# Patient Record
Sex: Female | Born: 1945
Health system: Southern US, Community
[De-identification: ages and names within clinical notes are randomized; demographics above are authoritative.]

## PROBLEM LIST (undated history)

## (undated) DIAGNOSIS — Z9289 Personal history of other medical treatment: Secondary | ICD-10-CM

## (undated) DIAGNOSIS — R51 Headache: Secondary | ICD-10-CM

## (undated) DIAGNOSIS — I471 Supraventricular tachycardia, unspecified: Secondary | ICD-10-CM

## (undated) DIAGNOSIS — K219 Gastro-esophageal reflux disease without esophagitis: Secondary | ICD-10-CM

## (undated) DIAGNOSIS — T8859XA Other complications of anesthesia, initial encounter: Secondary | ICD-10-CM

## (undated) DIAGNOSIS — I472 Ventricular tachycardia: Secondary | ICD-10-CM

## (undated) DIAGNOSIS — I4510 Unspecified right bundle-branch block: Secondary | ICD-10-CM

## (undated) DIAGNOSIS — R519 Headache, unspecified: Secondary | ICD-10-CM

## (undated) DIAGNOSIS — Z950 Presence of cardiac pacemaker: Secondary | ICD-10-CM

## (undated) DIAGNOSIS — Z8679 Personal history of other diseases of the circulatory system: Secondary | ICD-10-CM

## (undated) DIAGNOSIS — T4145XA Adverse effect of unspecified anesthetic, initial encounter: Secondary | ICD-10-CM

## (undated) DIAGNOSIS — Z87898 Personal history of other specified conditions: Secondary | ICD-10-CM

## (undated) DIAGNOSIS — I10 Essential (primary) hypertension: Secondary | ICD-10-CM

## (undated) DIAGNOSIS — I4729 Other ventricular tachycardia: Secondary | ICD-10-CM

## (undated) DIAGNOSIS — S0990XA Unspecified injury of head, initial encounter: Secondary | ICD-10-CM

## (undated) DIAGNOSIS — I455 Other specified heart block: Secondary | ICD-10-CM

## (undated) HISTORY — DX: Other specified heart block: I45.5

## (undated) HISTORY — DX: Personal history of other diseases of the circulatory system: Z86.79

## (undated) HISTORY — DX: Essential (primary) hypertension: I10

## (undated) HISTORY — DX: Gastro-esophageal reflux disease without esophagitis: K21.9

## (undated) HISTORY — DX: Presence of cardiac pacemaker: Z95.0

## (undated) HISTORY — DX: Other ventricular tachycardia: I47.29

## (undated) HISTORY — DX: Personal history of other medical treatment: Z92.89

## (undated) HISTORY — DX: Personal history of other specified conditions: Z87.898

## (undated) HISTORY — DX: Supraventricular tachycardia, unspecified: I47.10

## (undated) HISTORY — DX: Ventricular tachycardia: I47.2

## (undated) HISTORY — PX: OTHER SURGICAL HISTORY: SHX169

## (undated) HISTORY — PX: CHOLECYSTECTOMY: SHX55

## (undated) HISTORY — DX: Supraventricular tachycardia: I47.1

## (undated) HISTORY — DX: Unspecified right bundle-branch block: I45.10

---

## 1998-10-10 ENCOUNTER — Other Ambulatory Visit: Admission: RE | Admit: 1998-10-10 | Discharge: 1998-10-10 | Payer: Self-pay | Admitting: *Deleted

## 1999-10-31 ENCOUNTER — Other Ambulatory Visit: Admission: RE | Admit: 1999-10-31 | Discharge: 1999-10-31 | Payer: Self-pay | Admitting: *Deleted

## 2000-01-10 ENCOUNTER — Encounter (INDEPENDENT_AMBULATORY_CARE_PROVIDER_SITE_OTHER): Payer: Self-pay | Admitting: Specialist

## 2000-01-10 ENCOUNTER — Ambulatory Visit (HOSPITAL_COMMUNITY): Admission: RE | Admit: 2000-01-10 | Discharge: 2000-01-10 | Payer: Self-pay | Admitting: Gastroenterology

## 2000-01-16 ENCOUNTER — Ambulatory Visit (HOSPITAL_COMMUNITY): Admission: RE | Admit: 2000-01-16 | Discharge: 2000-01-16 | Payer: Self-pay | Admitting: Gastroenterology

## 2001-12-09 ENCOUNTER — Encounter: Payer: Self-pay | Admitting: Gastroenterology

## 2001-12-09 ENCOUNTER — Ambulatory Visit (HOSPITAL_COMMUNITY): Admission: RE | Admit: 2001-12-09 | Discharge: 2001-12-09 | Payer: Self-pay | Admitting: Gastroenterology

## 2002-08-11 ENCOUNTER — Ambulatory Visit (HOSPITAL_COMMUNITY): Admission: RE | Admit: 2002-08-11 | Discharge: 2002-08-11 | Payer: Self-pay

## 2002-08-11 ENCOUNTER — Encounter: Payer: Self-pay | Admitting: Obstetrics and Gynecology

## 2002-08-13 ENCOUNTER — Other Ambulatory Visit: Admission: RE | Admit: 2002-08-13 | Discharge: 2002-08-13 | Payer: Self-pay | Admitting: Obstetrics and Gynecology

## 2002-08-18 ENCOUNTER — Encounter: Payer: Self-pay | Admitting: Endocrinology

## 2002-08-18 ENCOUNTER — Encounter: Admission: RE | Admit: 2002-08-18 | Discharge: 2002-08-18 | Payer: Self-pay | Admitting: Endocrinology

## 2003-08-12 ENCOUNTER — Ambulatory Visit (HOSPITAL_COMMUNITY): Admission: RE | Admit: 2003-08-12 | Discharge: 2003-08-12 | Payer: Self-pay | Admitting: Internal Medicine

## 2004-03-14 ENCOUNTER — Ambulatory Visit: Payer: Self-pay | Admitting: Internal Medicine

## 2004-03-21 ENCOUNTER — Ambulatory Visit: Payer: Self-pay | Admitting: Internal Medicine

## 2005-02-23 ENCOUNTER — Ambulatory Visit (HOSPITAL_COMMUNITY): Admission: RE | Admit: 2005-02-23 | Discharge: 2005-02-23 | Payer: Self-pay | Admitting: Internal Medicine

## 2006-08-03 ENCOUNTER — Emergency Department (HOSPITAL_COMMUNITY): Admission: EM | Admit: 2006-08-03 | Discharge: 2006-08-03 | Payer: Self-pay | Admitting: Emergency Medicine

## 2006-10-02 ENCOUNTER — Encounter: Admission: RE | Admit: 2006-10-02 | Discharge: 2006-10-02 | Payer: Self-pay | Admitting: Obstetrics & Gynecology

## 2007-04-17 DIAGNOSIS — I455 Other specified heart block: Secondary | ICD-10-CM

## 2007-04-17 DIAGNOSIS — S0990XA Unspecified injury of head, initial encounter: Secondary | ICD-10-CM

## 2007-04-17 HISTORY — DX: Other specified heart block: I45.5

## 2007-04-17 HISTORY — DX: Unspecified injury of head, initial encounter: S09.90XA

## 2008-01-14 ENCOUNTER — Ambulatory Visit (HOSPITAL_COMMUNITY): Admission: RE | Admit: 2008-01-14 | Discharge: 2008-01-15 | Payer: Self-pay | Admitting: Cardiology

## 2008-01-14 DIAGNOSIS — Z950 Presence of cardiac pacemaker: Secondary | ICD-10-CM | POA: Insufficient documentation

## 2008-01-14 HISTORY — DX: Presence of cardiac pacemaker: Z95.0

## 2008-01-14 HISTORY — PX: PACEMAKER INSERTION: SHX728

## 2009-07-15 ENCOUNTER — Encounter: Admission: RE | Admit: 2009-07-15 | Discharge: 2009-07-15 | Payer: Self-pay | Admitting: Obstetrics & Gynecology

## 2010-01-16 ENCOUNTER — Encounter: Admission: RE | Admit: 2010-01-16 | Discharge: 2010-01-16 | Payer: Self-pay | Admitting: Obstetrics & Gynecology

## 2010-04-16 HISTORY — PX: COLONOSCOPY W/ POLYPECTOMY: SHX1380

## 2010-05-16 ENCOUNTER — Other Ambulatory Visit: Payer: Self-pay | Admitting: Dermatology

## 2010-05-31 ENCOUNTER — Other Ambulatory Visit: Payer: Self-pay | Admitting: Obstetrics & Gynecology

## 2010-05-31 DIAGNOSIS — Z1231 Encounter for screening mammogram for malignant neoplasm of breast: Secondary | ICD-10-CM

## 2010-07-18 ENCOUNTER — Ambulatory Visit
Admission: RE | Admit: 2010-07-18 | Discharge: 2010-07-18 | Disposition: A | Discharge status: Home / Self Care | Payer: No Typology Code available for payment source | Source: Ambulatory Visit | Attending: Obstetrics & Gynecology | Admitting: Obstetrics & Gynecology

## 2010-07-18 DIAGNOSIS — Z1231 Encounter for screening mammogram for malignant neoplasm of breast: Secondary | ICD-10-CM

## 2010-09-01 NOTE — Procedures (Signed)
[facility]  Patient:    Tiffany Baird, Tiffany Baird                   MRN: [id number] Proc. Date: 01/16/00 Adm. Date:  [id number] Attending:  Deneen Harts CC:         Kerry Kass, M.D. [facility]   Procedure Report  PROCEDURE:  Panendoscopy with biopsy, esophageal dilation.  INDICATIONS:  A 65 year old white female with symptoms of pyrosis, regurgitation, and occasional solid food dysphagia.  Undergoing endoscopy to further evaluate the etiology of symptoms and for a possible therapeutic intervention.  DESCRIPTION OF PROCEDURE:  After reviewing the nature of the procedure with the patient including potential risks of hemorrhage and perforation, informed consent was signed.  The patient was premedicated with topical anesthetic followed by IV sedation, totalling Versed 7.5 mg, fentanyl 75 mcg administered IV in divided doses prior to the onset of the procedure.  The patient also received topical oropharyngeal anesthetic.  Using the Olympus video endoscope, proximal esophagus was intubated under direct vision.  The oropharynx was normal.  No lesion of the epiglottis, vocal cords, or piriform sinus.  The proximal and mid esophagus were normal.  The distal esophagus was normal to the mucosal Z-line.  A Schatzkis ring was present at 37 cm.  No evidence of reflux-induced inflammation.  The Schatzkis ring was endoscopically evident but not obstructing.  Moderate hiatal hernia extending to 40 cm, noninflamed.  Gastric fundus and body were normal.  Antrum remarkable for erosive gastritis.  No ulceration.  Mucosa characterized by edema, erythema, and superficial mucosal erosions.  Pylorus symmetric. DuodenaL bulb with similar erosive inflammation.  Second portion of the duodenum was normal.  Retroflexed view of the angularis, lesser curve, gastric cardia, and fundus were normal.  Biopsies were obtained from the angularis and prepyloric antrum for Helicobacter.   Stomach was then decompressed and scope withdrawn.  Immediately following endoscopy, a Maloney 50 [place] dilator was passed to 45 cm.  There was minimal distal esophageal resistance.  No additional dilators were passed.  The patient tolerated this without chest pain, without coughing, without blood staining of the dilator.  The patient remained stable throughout the procedures being maintained on low-flow oxygen and Datascope monitor throughout.  Returned to recovery in stable condition.  ASSESSMENT: 1. Schatzkis ring - probable source of solid food dysphagia. 2. Moderate hiatal hernia, noninflamed. 3. [name] 50 [place] abrupt dilation. 4. Erosive antritis. 5. Erosive duodenitis.  RECOMMENDATION: 1. Prevacid 30 mg p.o. q.a.m. 2. Treat if Helicobacter positive on CLO biopsy. 3. Avoid gastric irritants. DD:  01/16/00 TD:  01/16/00 Job: [id number]

## 2010-09-01 NOTE — Procedures (Signed)
[facility]  Patient:    Tiffany Baird, Tiffany Baird                   MRN: [id number] Proc. Date: 01/10/00 Adm. Date:  [id number] Attending:  Deneen Harts CC:         Kerry Kass, M.D. [facility]   Procedure Report  PROCEDURE:  Colonoscopic polypectomy.  INDICATION FOR PROCEDURE:  A 65 year old white female undergoing colonoscopy for neoplasia surveillance. Symptoms of intermittent hematochezia. Recent history of infectious diarrhea following a trip to [place] one month ago. The symptoms resolved with Cipro for 1 week.  DESCRIPTION OF PROCEDURE:  After reviewing the nature of the procedure with the patient including potential risks and complications, and after discussing alternative methods of diagnosis and treatment, informed consent was signed.  The patient was premedicated receiving IV sedation totaling Versed 9 mg, fentanyl 87.5 mcg IV.  Using the Olympus pediatric PCF-140L pediatric video colonoscope, the rectum was intubated after a normal digital examination which revealed no evidence of perianal or intrarectal pathology. The scope was advanced with some difficulty through a tortuous colon. The cecum was identified by crows feet, ileocecal valve, and transillumination of the right lower quadrant. Preparation was good throughout.  The scope was slowly withdrawn with careful inspection of the entire colon in a retrograde manner with excellent visualization throughout. In the mid descending colon at approximately 65 cm, a sessile polyp, 6 mm in diameter, was resected with hot biopsy forceps. This appeared to be an adenoma. It is submitted to pathology. No additional neoplasia identified. In the rectum, on retroflexed view, internal hemorrhoids are noted. This is a probable explanation for the patients hematochezia. The colon was then decompressed, scope withdrawn. The mucosa was intact throughout without vascular abnormality or other  lesion.  The patient tolerated the procedure without difficulty being maintained on DataScope monitor and low flow oxygen throughout. Returned to recovery in stable condition.  Time 3, technical 2, preparation 2, total score equal 7.  ASSESSMENT: 1. Descending colon polyp--resected, pathology pending. 2. Internal hemorrhoids--probable source of hematochezia.  RECOMMENDATIONS: 1. Post polypectomy instructions reviewed. 2. Follow-up pathology. 3. Repeat colonoscopy in 3 years if adenoma, 10 years if hyperplastic. 4. Annual Hemoccults--per Dr. Stevphen Rochester. DD:  01/10/00 TD:  01/11/00 Job: [id number]

## 2011-01-15 LAB — BASIC METABOLIC PANEL
BUN: 20
CO2: 24
Calcium: 9.5
Chloride: 111
Creatinine, Ser: 0.78
GFR calc Af Amer: >60
GFR calc non Af Amer: >60
Glucose, Bld: 96
Potassium: 3.7
Sodium: 142

## 2011-01-15 LAB — PROTIME-INR
INR: 1
Prothrombin Time: 13.5

## 2011-01-15 LAB — CBC
HCT: 39.2
Hemoglobin: 13.3
MCHC: 33.9
MCV: 84.9
Platelets: 277
RBC: 4.62
RDW: 13.5
WBC: 5.5

## 2011-01-15 LAB — APTT: aPTT: 28

## 2011-03-27 DIAGNOSIS — Z9289 Personal history of other medical treatment: Secondary | ICD-10-CM

## 2011-03-27 HISTORY — DX: Personal history of other medical treatment: Z92.89

## 2011-03-27 HISTORY — PX: US ECHOCARDIOGRAPHY: HXRAD669

## 2011-03-27 HISTORY — PX: NM MYOCAR PERF WALL MOTION: HXRAD629

## 2012-09-29 ENCOUNTER — Other Ambulatory Visit: Payer: Self-pay | Admitting: Cardiovascular Disease

## 2012-09-29 DIAGNOSIS — I495 Sick sinus syndrome: Secondary | ICD-10-CM

## 2012-09-29 DIAGNOSIS — I4891 Unspecified atrial fibrillation: Secondary | ICD-10-CM

## 2012-09-29 LAB — PACEMAKER DEVICE OBSERVATION

## 2012-09-30 ENCOUNTER — Encounter: Payer: Self-pay | Admitting: *Deleted

## 2012-09-30 LAB — REMOTE PACEMAKER DEVICE
AL AMPLITUDE: 1.4 mv
AL THRESHOLD: 0.75 V
BAMS-0001: 175 {beats}/min
BATTERY VOLTAGE: 2.8 V
RV LEAD AMPLITUDE: 5.6 mv
RV LEAD IMPEDENCE PM: 531 Ohm
VENTRICULAR PACING PM: 1.3

## 2012-10-08 ENCOUNTER — Other Ambulatory Visit: Payer: Self-pay | Admitting: Obstetrics & Gynecology

## 2012-10-08 DIAGNOSIS — E2839 Other primary ovarian failure: Secondary | ICD-10-CM

## 2012-10-08 DIAGNOSIS — Z780 Asymptomatic menopausal state: Secondary | ICD-10-CM

## 2012-10-14 ENCOUNTER — Encounter: Payer: Self-pay | Admitting: Cardiovascular Disease

## 2012-12-31 ENCOUNTER — Other Ambulatory Visit: Payer: Self-pay | Admitting: Cardiovascular Disease

## 2012-12-31 LAB — PACEMAKER DEVICE OBSERVATION

## 2013-01-23 ENCOUNTER — Encounter: Payer: Self-pay | Admitting: *Deleted

## 2013-01-23 LAB — REMOTE PACEMAKER DEVICE
AL AMPLITUDE: 1.4 mv
AL THRESHOLD: 0.875 V
ATRIAL PACING PM: 13.8
BAMS-0001: 175 {beats}/min
BATTERY VOLTAGE: 2.8 V
RV LEAD AMPLITUDE: 8 mv
RV LEAD IMPEDENCE PM: 543 Ohm

## 2013-02-19 ENCOUNTER — Ambulatory Visit (INDEPENDENT_AMBULATORY_CARE_PROVIDER_SITE_OTHER): Payer: Medicare Other | Admitting: Cardiovascular Disease

## 2013-02-19 ENCOUNTER — Ambulatory Visit (INDEPENDENT_AMBULATORY_CARE_PROVIDER_SITE_OTHER): Payer: Medicare Other | Admitting: *Deleted

## 2013-02-19 ENCOUNTER — Encounter: Payer: Self-pay | Admitting: Cardiology

## 2013-02-19 VITALS — BP 120/80 | HR 65 | Resp 16 | Ht 64.0 in | Wt 188.0 lb

## 2013-02-19 DIAGNOSIS — I4729 Other ventricular tachycardia: Secondary | ICD-10-CM

## 2013-02-19 DIAGNOSIS — I472 Ventricular tachycardia: Secondary | ICD-10-CM

## 2013-02-19 DIAGNOSIS — Z950 Presence of cardiac pacemaker: Secondary | ICD-10-CM

## 2013-02-19 DIAGNOSIS — I471 Supraventricular tachycardia, unspecified: Secondary | ICD-10-CM

## 2013-02-19 DIAGNOSIS — I10 Essential (primary) hypertension: Secondary | ICD-10-CM

## 2013-02-19 DIAGNOSIS — R55 Syncope and collapse: Secondary | ICD-10-CM

## 2013-02-19 DIAGNOSIS — R002 Palpitations: Secondary | ICD-10-CM

## 2013-02-19 DIAGNOSIS — I495 Sick sinus syndrome: Secondary | ICD-10-CM

## 2013-02-19 DIAGNOSIS — Z8679 Personal history of other diseases of the circulatory system: Secondary | ICD-10-CM

## 2013-02-19 LAB — BASIC METABOLIC PANEL WITH GFR
BUN: 19 mg/dL (ref 6–23)
CO2: 29 mEq/L (ref 19–32)
Calcium: 9.8 mg/dL (ref 8.4–10.5)
Chloride: 104 mEq/L (ref 96–112)
Creat: 0.88 mg/dL (ref 0.50–1.10)
GFR, Est African American: 79 mL/min

## 2013-02-19 LAB — PACEMAKER DEVICE OBSERVATION

## 2013-02-19 MED ORDER — METOPROLOL TARTRATE 50 MG PO TABS: 25.0000 mg | ORAL_TABLET | Freq: Every day | ORAL | Status: DC

## 2013-02-19 NOTE — Assessment & Plan Note (Addendum)
Last p.m. attending a concert developed severe abdominal pain and hot sweaty felt faint she said she has had the symptoms several times in the past though extremely rare since her pacemaker insertion. She leaned against her husband and they exited the theater but she became more more lightheaded, very lightheaded and hot- then she had syncope EMS was called. She was awake on their arrival-no loss of bowel or bladder. EMS called- EKG with right bundle branch block. She refused to go to the hospital.  Better today though somewhat foggy.  She was reassured but instructed that if she felt lightheaded dizzy or similar symptoms that she should immediately lie down and drink lots of water. Last night she did have a glass of red wine prior to concert. Additionally pacemaker was adjusted to begin pacing at a earlier time.  She'll followup in 4 weeks for pacer check.

## 2013-02-19 NOTE — Assessment & Plan Note (Signed)
Patient had close to 7 second pulse on holter in 2009 and with her history of syncope it was felt pacemaker was treatment option of choice. Since the pacemaker has been in place she may feel lightheaded but has not actually had a syncopal spell until last night.  She was also concerned that she had a right bundle branch block she was reassured she had had that since 2009.  Pacemaker interrogated she had no arrhythmias in the last 24 hours. She had had as noted in the past some P. atrial tach and some short nonsustained ventricular tach.

## 2013-02-19 NOTE — Assessment & Plan Note (Signed)
She is on Lopressor 50 mg once a day I have asked her to take half a tablet twice a day as it is a shorter acting medication she is agreeable.

## 2013-02-19 NOTE — Assessment & Plan Note (Signed)
Stable

## 2013-02-19 NOTE — Patient Instructions (Addendum)
We adjusted your pacemaker, if your heart rate slows again, it will begin pacing sooner.  But your BP will also be lower so lie flat, and drink lots of water.  Being dehydrated may increase the episodes.  Follow up with Dr. Royann Shivers as instructed.  Your physician recommends that you schedule a follow-up appointment in: 4-6 weeks
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
## 2013-02-19 NOTE — Progress Notes (Signed)
In office pacemaker interrogation. Normal device function. Changes were made this session.

## 2013-02-23 NOTE — Progress Notes (Signed)
Pt. Informed about lab results.

## 2013-02-25 LAB — MDC_IDC_ENUM_SESS_TYPE_INCLINIC
Battery Impedance: 199 Ohm
Battery Remaining Longevity: 12
Battery Voltage: 2.79 V
Brady Statistic RA Percent Paced: 14.8 %
Brady Statistic RV Percent Paced: 1.1 %
Lead Channel Impedance Value: 476 Ohm
Lead Channel Pacing Threshold Amplitude: 0.75 V
Lead Channel Pacing Threshold Amplitude: 1 V
Lead Channel Pacing Threshold Pulse Width: 0.4 ms
Lead Channel Sensing Intrinsic Amplitude: 2 mV
Lead Channel Setting Pacing Amplitude: 2 V
Lead Channel Setting Pacing Pulse Width: 0.4 ms
Lead Channel Setting Sensing Sensitivity: 2.8 mV

## 2013-02-27 ENCOUNTER — Encounter: Payer: Self-pay | Admitting: Cardiovascular Disease

## 2013-02-27 NOTE — Progress Notes (Signed)
Patient ID: Tiffany Baird, female   DOB: 1945/07/15, 67 y.o.   MRN: [id number]  Normal device function. Thresholds, sensing, impedances consistent with previous measurements. Device programmed to maximize longevity. No mode switch or high ventricular rates noted since last [facility]. Device programmed at appropriate safety margins. Histogram distribution appropriate for patient activity level. Device programmed to optimize intrinsic conduction. Patient's rate drop was enabled this OV due to recent near syncopal episodes. "Drop" was programmed ON at nominal settings. Patient's base rate was lowered to 50ppm. Auto PVARP was D/C and a fixed interval of was programmed. Rate response and mode switch were also D/C'd this ov. Estimated longevity 12 years. Patient will follow up with MD on 03-31-2013.  Thurmon Fair, MD, [facility] [phone] office [phone] pager

## 2013-03-30 ENCOUNTER — Encounter: Payer: Self-pay | Admitting: *Deleted

## 2013-03-31 ENCOUNTER — Ambulatory Visit (INDEPENDENT_AMBULATORY_CARE_PROVIDER_SITE_OTHER): Payer: Medicare Other | Admitting: Cardiovascular Disease

## 2013-03-31 ENCOUNTER — Encounter: Payer: Self-pay | Admitting: Cardiovascular Disease

## 2013-03-31 VITALS — BP 126/76 | HR 83 | Resp 16 | Ht 64.5 in | Wt 190.1 lb

## 2013-03-31 DIAGNOSIS — I472 Ventricular tachycardia: Secondary | ICD-10-CM

## 2013-03-31 DIAGNOSIS — R55 Syncope and collapse: Secondary | ICD-10-CM

## 2013-03-31 DIAGNOSIS — I471 Supraventricular tachycardia, unspecified: Secondary | ICD-10-CM

## 2013-03-31 DIAGNOSIS — Z950 Presence of cardiac pacemaker: Secondary | ICD-10-CM

## 2013-03-31 DIAGNOSIS — I4729 Other ventricular tachycardia: Secondary | ICD-10-CM

## 2013-03-31 DIAGNOSIS — Z8679 Personal history of other diseases of the circulatory system: Secondary | ICD-10-CM

## 2013-03-31 LAB — PACEMAKER DEVICE OBSERVATION

## 2013-03-31 NOTE — Patient Instructions (Addendum)
Remote monitoring is used to monitor your pacemaker from home. This monitoring reduces the number of office visits required to check your device to one time per year. It allows [place] to keep an eye on the functioning of your device to ensure it is working properly. You are scheduled for a device check from home on 07-02-2013. You may send your transmission at any time that day. If you have a wireless device, the transmission will be sent automatically. After your physician reviews your transmission, you will receive a postcard with your next transmission date   Your physician recommends that you schedule a follow-up appointment in:12 months

## 2013-04-03 ENCOUNTER — Other Ambulatory Visit: Payer: Self-pay

## 2013-04-03 DIAGNOSIS — Z1231 Encounter for screening mammogram for malignant neoplasm of breast: Secondary | ICD-10-CM

## 2013-04-06 LAB — MDC_IDC_ENUM_SESS_TYPE_INCLINIC
Brady Statistic AP VS Percent: 0.5 %
Brady Statistic AS VS Percent: 99.4 %
Lead Channel Impedance Value: 544 Ohm
Lead Channel Pacing Threshold Amplitude: 0.75 V
Lead Channel Pacing Threshold Amplitude: 1 V
Lead Channel Pacing Threshold Pulse Width: 0.4 ms
Lead Channel Pacing Threshold Pulse Width: 0.4 ms
Lead Channel Sensing Intrinsic Amplitude: 8 mV
Lead Channel Setting Pacing Amplitude: 2 V
Lead Channel Setting Pacing Pulse Width: 0.4 ms
Lead Channel Setting Sensing Sensitivity: 2.8 mV

## 2013-04-08 ENCOUNTER — Encounter: Payer: Self-pay | Admitting: Cardiovascular Disease

## 2013-04-08 DIAGNOSIS — R55 Syncope and collapse: Secondary | ICD-10-CM | POA: Insufficient documentation

## 2013-04-08 NOTE — Progress Notes (Signed)
Patient ID: Tiffany Baird, female   DOB: [birth year], 67 y.o.   MRN: [id number]      Reason for office visit Pacemaker followup, supraventricular tachycardia  Tiffany Baird returns in followup. She was seen about 2 weeks ago by Tiffany Baird our nurse practitioner with complaints of syncope. This happened while she was at a music concert. The episode of syncope was associated with abdominal pain and preceded by a sensation of flushing, dizziness and diaphoresis, all suggestive of a vagal episode. Her pacemaker was implanted in 2009 after she had a detected 6.7 second pause on an event monitor.  Tiffany Baird was rightly diagnosed as having neurocardiogenic syncope primarily due to a vasodepressive component, with a cardioinhibitory component being prevented by her pacemaker. Over the years she has had numerous syncopal events, similar to those described above. After implantation of her pacemaker, atrial tachycardia and brief nonsustained ventricular tachycardia were recorded by her device but were not associated with symptoms.   Allergies  Allergen Reactions  . Codeine   . Hydrocodone   . Sumycin [Tetracycline]   . Tegretol [Carbamazepine]     Current Outpatient Prescriptions  Medication Sig Dispense Refill  . calcium carbonate (OS-CAL) 600 MG TABS tablet Take 600 mg by mouth daily.      . cholecalciferol (VITAMIN D) 1000 UNITS tablet Take 1,000 Units by mouth daily.      Marland Kitchen co-enzyme Q-10 30 MG capsule Take 200 mg by mouth daily.       . metoprolol (LOPRESSOR) 50 MG tablet Take 50 mg by mouth daily.      . Multiple Vitamin (MULTIVITAMIN) tablet Take 1 tablet by mouth daily.      . naproxen sodium (ANAPROX) 220 MG tablet Take 220 mg by mouth 2 (two) times daily with a meal.      . pantoprazole (PROTONIX) 40 MG tablet Take 40 mg by mouth daily.       No current facility-administered medications for this visit.    Past Medical History  Diagnosis Date  . Hx of syncope     X 18 years  . Sinus  arrest 2009    6.7 sec pause with hx syncope  . RBBB   . Hx of cardiac pacemaker 01/14/08    medtronic adapta  . NSVT (nonsustained ventricular tachycardia)     on pacer checks  . PSVT (paroxysmal supraventricular tachycardia)     on pacer checks  . H/O echocardiogram 03/27/2011    EF >55%, mild MR, mild TR, RV systolic pressure 30-76mmHg,   . H/O cardiovascular stress test 03/27/2011    EF 86%, no ischemia  . GERD (gastroesophageal reflux disease)   . HTN (hypertension)   . H/O: rheumatic fever     as a child    Past Surgical History  Procedure Laterality Date  . Pacemaker insertion  01/14/2008    Medtronic   . Cholecystectomy    . Dental implants    . Fractured leg    . US echocardiography  03/27/11    mild TR, no significant valvular disease.  Marland Kitchen Nm myocar perf wall motion  03/27/11    normal    Family History  Problem Relation Age of Onset  . Cancer Mother   . Diabetes Mother   . Heart disease Father   . Diabetes Brother     History   Social History  . Marital Status: Married    Spouse Name: N/A    Number of Children: N/A  . Years  of Education: N/A   Occupational History  . Not on file.   Social History Main Topics  . Smoking status: Never Smoker   . Smokeless tobacco: Never Used  . Alcohol Use: 2.4 oz/week    4 Glasses of wine per week  . Drug Use: No  . Sexual Activity: Not on file   Other Topics Concern  . Not on file   Social History Narrative  . No narrative on file    Review of systems: The patient specifically denies any chest pain at rest or with exertion, dyspnea at rest or with exertion, orthopnea, paroxysmal nocturnal dyspnea, syncope, palpitations, focal neurological deficits, intermittent claudication, lower extremity edema, unexplained weight gain, cough, hemoptysis or wheezing.  The patient also denies abdominal pain, nausea, vomiting, dysphagia, diarrhea, constipation, polyuria, polydipsia, dysuria, hematuria, frequency, urgency,  abnormal bleeding or bruising, fever, chills, unexpected weight changes, mood swings, change in skin or hair texture, change in voice quality, auditory or visual problems, allergic reactions or rashes, new musculoskeletal complaints other than usual "aches and pains".   PHYSICAL EXAM BP 126/76  Pulse 83  Resp 16  Ht 5' 4.5" (1.638 m)  Wt 190 lb 1.6 oz (86.229 kg)  BMI 32.14 kg/m2  General: Alert, oriented x3, no distress Head: no evidence of trauma, PERRL, EOMI, no exophtalmos or lid lag, no myxedema, no xanthelasma; normal ears, nose and oropharynx Neck: normal jugular venous pulsations and no hepatojugular reflux; brisk carotid pulses without delay and no carotid bruits Chest: clear to auscultation, no signs of consolidation by percussion or palpation, normal fremitus, symmetrical and full respiratory excursions; the subclavian pacemaker site appears healthy Cardiovascular: normal position and quality of the apical impulse, regular rhythm, normal first and widely split second heart sounds, no murmurs, rubs or gallops Abdomen: no tenderness or distention, no masses by palpation, no abnormal pulsatility or arterial bruits, normal bowel sounds, no hepatosplenomegaly Extremities: no clubbing, cyanosis or edema; 2+ radial, ulnar and brachial pulses bilaterally; 2+ right femoral, posterior tibial and dorsalis pedis pulses; 2+ left femoral, posterior tibial and dorsalis pedis pulses; no subclavian or femoral bruits Neurological: grossly nonfocal   EKG: Sinus rhythm, right bundle branch block (chronic)   BMET    Component Value Date/Time   NA 140 02/19/2013 1352   K 4.4 02/19/2013 1352   CL 104 02/19/2013 1352   CO2 29 02/19/2013 1352   GLUCOSE 111* 02/19/2013 1352   BUN 19 02/19/2013 1352   CREATININE 0.88 02/19/2013 1352   CREATININE 0.78 01/14/2008 1130   CALCIUM 9.8 02/19/2013 1352   GFRNONAA >60 01/14/2008 1130   GFRAA  Value: >60        The eGFR has been calculated using the MDRD equation.  This calculation has not been validated in all clinical 01/14/2008 1130     ASSESSMENT AND PLAN Hx of cardiac pacemaker Device function is normal.  The device was reportedly implanted for tachycardia bradycardia syndrome and post tachycardia pauses, but it appears more likely that she truly has vasovagal events and does not have true conduction system disease. Rate drop feature is active. Other than rate drop interventions, she rarely paces, consistent with neurocardiogenic syncope rather than conduction system disease. No permanent programming changes were made today.  Neurocardiogenic syncope She can still have vasodepressor syncope even with pacemaker in place. She is warned to avoid triggers of syncope. She should pay any attention to any prodromal symptoms such as the nausea, lightheadedness and diaphoresis that she recently experienced. She should immediately  assume a supine position if these occur. She is to stay well hydrated and eat a relatively high sodium diet. I agree with Laura's advice that she should take her metoprolol in twice daily dosing. Brief episodes of NSVT and atrial tachycardia did not occur around the time of her syncopal events and did not appear to be clinically important. They can also be helped by beta blocker therapy.   Patient Instructions  Remote monitoring is used to monitor your pacemaker from home. This monitoring reduces the number of office visits required to check your device to one time per year. It allows [place] to keep an eye on the functioning of your device to ensure it is working properly. You are scheduled for a device check from home on 07-02-2013. You may send your transmission at any time that day. If you have a wireless device, the transmission will be sent automatically. After your physician reviews your transmission, you will receive a postcard with your next transmission date   Your physician recommends that you schedule a follow-up appointment in:12  months      Orders Placed This Encounter  Procedures  . Implantable device check   Meds ordered this encounter  Medications  . metoprolol (LOPRESSOR) 50 MG tablet    Sig: Take 50 mg by mouth daily.    Junious Silk, MD, [facility] [phone] office [phone] pager

## 2013-04-08 NOTE — Assessment & Plan Note (Addendum)
Device function is normal.  The device was reportedly implanted for tachycardia bradycardia syndrome and post tachycardia pauses, but it appears more likely that she truly has vasovagal events and does not have true conduction system disease. Rate drop feature is active. Other than rate drop interventions, she rarely paces, consistent with neurocardiogenic syncope rather than conduction system disease. No permanent programming changes were made today.

## 2013-04-08 NOTE — Assessment & Plan Note (Addendum)
She can still have vasodepressor syncope even with pacemaker in place. She is warned to avoid triggers of syncope. She should pay any attention to any prodromal symptoms such as the nausea, lightheadedness and diaphoresis that she recently experienced. She should immediately assume a supine position if these occur. She is to stay well hydrated and eat a relatively high sodium diet. I agree with Laura's advice that she should take her metoprolol in twice daily dosing. Brief episodes of NSVT and atrial tachycardia did not occur around the time of her syncopal events and did not appear to be clinically important. They can also be helped by beta blocker therapy.

## 2013-04-10 ENCOUNTER — Ambulatory Visit
Admission: RE | Admit: 2013-04-10 | Discharge: 2013-04-10 | Disposition: A | Discharge status: Home / Self Care | Payer: Medicare Other | Source: Ambulatory Visit

## 2013-04-10 DIAGNOSIS — Z1231 Encounter for screening mammogram for malignant neoplasm of breast: Secondary | ICD-10-CM

## 2013-07-02 ENCOUNTER — Ambulatory Visit (INDEPENDENT_AMBULATORY_CARE_PROVIDER_SITE_OTHER): Payer: Medicare HMO | Admitting: *Deleted

## 2013-07-02 DIAGNOSIS — R55 Syncope and collapse: Secondary | ICD-10-CM

## 2013-07-02 LAB — PACEMAKER DEVICE OBSERVATION

## 2013-07-02 LAB — MDC_IDC_ENUM_SESS_TYPE_REMOTE
Battery Remaining Longevity: 144 mo
Battery Voltage: 2.8 V
Brady Statistic AP VP Percent: 0.1 %
Brady Statistic AP VS Percent: 0.2 %
Brady Statistic AS VP Percent: 0.1 %
Brady Statistic AS VS Percent: 99.7 %
Lead Channel Impedance Value: 469 Ohm
Lead Channel Impedance Value: 539 Ohm
Lead Channel Pacing Threshold Amplitude: 1.625 V
Lead Channel Sensing Intrinsic Amplitude: 1.4 mV
Lead Channel Setting Pacing Amplitude: 1.75 V
Lead Channel Setting Pacing Amplitude: 2 V
Lead Channel Setting Pacing Pulse Width: 0.4 ms
MDC IDC MSMT LEADCHNL RA PACING THRESHOLD PULSEWIDTH: 0.4 ms
MDC IDC MSMT LEADCHNL RV PACING THRESHOLD AMPLITUDE: 0.875 V
MDC IDC MSMT LEADCHNL RV PACING THRESHOLD PULSEWIDTH: 0.4 ms
MDC IDC MSMT LEADCHNL RV SENSING INTR AMPL: 5.6 mV
MDC IDC SET LEADCHNL RV SENSING SENSITIVITY: 2.8 mV

## 2013-07-14 NOTE — Progress Notes (Signed)
PPM remote 

## 2013-07-17 ENCOUNTER — Encounter: Payer: Self-pay | Admitting: *Deleted

## 2013-08-04 ENCOUNTER — Encounter (INDEPENDENT_AMBULATORY_CARE_PROVIDER_SITE_OTHER): Payer: Self-pay

## 2013-08-04 ENCOUNTER — Ambulatory Visit
Admission: RE | Admit: 2013-08-04 | Discharge: 2013-08-04 | Disposition: A | Discharge status: Home / Self Care | Payer: Medicare HMO | Source: Ambulatory Visit | Attending: Obstetrics & Gynecology | Admitting: Obstetrics & Gynecology

## 2013-08-04 DIAGNOSIS — E2839 Other primary ovarian failure: Secondary | ICD-10-CM

## 2013-10-05 ENCOUNTER — Telehealth: Payer: Self-pay | Admitting: Cardiology

## 2013-10-05 ENCOUNTER — Encounter: Payer: Medicare HMO | Admitting: *Deleted

## 2013-10-05 NOTE — Telephone Encounter (Signed)
Spoke with pt and reminded pt of remote transmission that is due today. Pt verbalized understanding.   

## 2013-10-07 ENCOUNTER — Encounter: Payer: Self-pay | Admitting: Cardiology

## 2013-10-13 ENCOUNTER — Ambulatory Visit (INDEPENDENT_AMBULATORY_CARE_PROVIDER_SITE_OTHER): Payer: Medicare HMO | Admitting: *Deleted

## 2013-10-13 DIAGNOSIS — I4729 Other ventricular tachycardia: Secondary | ICD-10-CM

## 2013-10-13 DIAGNOSIS — I471 Supraventricular tachycardia: Secondary | ICD-10-CM

## 2013-10-13 DIAGNOSIS — I472 Ventricular tachycardia: Secondary | ICD-10-CM

## 2013-10-14 LAB — MDC_IDC_ENUM_SESS_TYPE_REMOTE
Battery Impedance: 247 Ohm
Battery Voltage: 2.8 V
Brady Statistic AP VP Percent: 0 %
Brady Statistic AP VS Percent: 0 %
Brady Statistic AS VP Percent: 0 %
Brady Statistic AS VS Percent: 100 %
Date Time Interrogation Session: 20150701000120
Lead Channel Impedance Value: 476 Ohm
Lead Channel Impedance Value: 505 Ohm
Lead Channel Pacing Threshold Amplitude: 1 V
Lead Channel Pacing Threshold Pulse Width: 0.4 ms
Lead Channel Pacing Threshold Pulse Width: 0.4 ms
Lead Channel Sensing Intrinsic Amplitude: 5.6 mV
Lead Channel Setting Pacing Amplitude: 2 V
Lead Channel Setting Sensing Sensitivity: 2.8 mV
MDC IDC MSMT BATTERY REMAINING LONGEVITY: 142 mo
MDC IDC MSMT LEADCHNL RA SENSING INTR AMPL: 1.4 mV
MDC IDC MSMT LEADCHNL RV PACING THRESHOLD AMPLITUDE: 1 V
MDC IDC SET LEADCHNL RA PACING AMPLITUDE: 2 V
MDC IDC SET LEADCHNL RV PACING PULSEWIDTH: 0.4 ms

## 2013-10-14 NOTE — Progress Notes (Signed)
Remote pacemaker transmission.   

## 2013-11-03 ENCOUNTER — Encounter: Payer: Self-pay | Admitting: Cardiology

## 2013-11-05 ENCOUNTER — Encounter: Payer: Self-pay | Admitting: Cardiovascular Disease

## 2014-01-14 ENCOUNTER — Encounter: Payer: Self-pay | Admitting: Cardiovascular Disease

## 2014-01-14 ENCOUNTER — Ambulatory Visit (INDEPENDENT_AMBULATORY_CARE_PROVIDER_SITE_OTHER): Payer: Medicare HMO | Admitting: *Deleted

## 2014-01-14 ENCOUNTER — Telehealth: Payer: Self-pay | Admitting: Cardiology

## 2014-01-14 DIAGNOSIS — I495 Sick sinus syndrome: Secondary | ICD-10-CM

## 2014-01-14 NOTE — Telephone Encounter (Signed)
Spoke with pt and reminded pt of remote transmission that is due today. Pt verbalized understanding.   

## 2014-01-15 NOTE — Progress Notes (Signed)
Remote pacemaker transmission.   

## 2014-01-18 LAB — MDC_IDC_ENUM_SESS_TYPE_REMOTE
Battery Impedance: 270 Ohm
Battery Remaining Longevity: 139 mo
Battery Voltage: 2.8 V
Brady Statistic AP VP Percent: 0 %
Date Time Interrogation Session: 20151001223631
Lead Channel Impedance Value: 497 Ohm
Lead Channel Impedance Value: 557 Ohm
Lead Channel Pacing Threshold Amplitude: 0.875 V
Lead Channel Pacing Threshold Pulse Width: 0.4 ms
Lead Channel Pacing Threshold Pulse Width: 0.4 ms
Lead Channel Sensing Intrinsic Amplitude: 1.4 mV
Lead Channel Setting Pacing Amplitude: 1.75 V
Lead Channel Setting Pacing Amplitude: 2 V
Lead Channel Setting Pacing Pulse Width: 0.4 ms
Lead Channel Setting Sensing Sensitivity: 2.8 mV
MDC IDC MSMT LEADCHNL RV PACING THRESHOLD AMPLITUDE: 0.75 V
MDC IDC MSMT LEADCHNL RV SENSING INTR AMPL: 5.6 mV
MDC IDC STAT BRADY AP VS PERCENT: 0 %
MDC IDC STAT BRADY AS VP PERCENT: 0 %
MDC IDC STAT BRADY AS VS PERCENT: 100 %

## 2014-01-20 ENCOUNTER — Encounter: Payer: Self-pay | Admitting: Cardiology

## 2014-03-30 ENCOUNTER — Ambulatory Visit (INDEPENDENT_AMBULATORY_CARE_PROVIDER_SITE_OTHER): Payer: Medicare HMO | Admitting: Cardiovascular Disease

## 2014-03-30 ENCOUNTER — Encounter: Payer: Self-pay | Admitting: Cardiovascular Disease

## 2014-03-30 VITALS — BP 140/100 | HR 71 | Resp 16 | Ht 65.0 in | Wt 206.5 lb

## 2014-03-30 DIAGNOSIS — I472 Ventricular tachycardia: Secondary | ICD-10-CM

## 2014-03-30 DIAGNOSIS — I10 Essential (primary) hypertension: Secondary | ICD-10-CM

## 2014-03-30 DIAGNOSIS — Z8679 Personal history of other diseases of the circulatory system: Secondary | ICD-10-CM

## 2014-03-30 DIAGNOSIS — I4729 Other ventricular tachycardia: Secondary | ICD-10-CM

## 2014-03-30 DIAGNOSIS — I471 Supraventricular tachycardia, unspecified: Secondary | ICD-10-CM

## 2014-03-30 DIAGNOSIS — R55 Syncope and collapse: Secondary | ICD-10-CM

## 2014-03-30 DIAGNOSIS — Z950 Presence of cardiac pacemaker: Secondary | ICD-10-CM

## 2014-03-30 LAB — MDC_IDC_ENUM_SESS_TYPE_INCLINIC
Battery Impedance: 270 Ohm
Brady Statistic AP VS Percent: 0 %
Brady Statistic AS VP Percent: 0 %
Brady Statistic AS VS Percent: 100 %
Date Time Interrogation Session: 20151215124929
Lead Channel Impedance Value: 529 Ohm
Lead Channel Pacing Threshold Amplitude: 0.75 V
Lead Channel Pacing Threshold Amplitude: 1 V
Lead Channel Pacing Threshold Pulse Width: 0.4 ms
Lead Channel Pacing Threshold Pulse Width: 0.4 ms
Lead Channel Setting Pacing Amplitude: 1.75 V
Lead Channel Setting Pacing Amplitude: 2 V
Lead Channel Setting Sensing Sensitivity: 2.8 mV
MDC IDC MSMT BATTERY REMAINING LONGEVITY: 139 mo
MDC IDC MSMT BATTERY VOLTAGE: 2.79 V
MDC IDC MSMT LEADCHNL RA IMPEDANCE VALUE: 475 Ohm
MDC IDC MSMT LEADCHNL RA SENSING INTR AMPL: 2 mV
MDC IDC MSMT LEADCHNL RV SENSING INTR AMPL: 8 mV
MDC IDC SET LEADCHNL RV PACING PULSEWIDTH: 0.4 ms
MDC IDC STAT BRADY AP VP PERCENT: 0 %

## 2014-03-30 NOTE — Patient Instructions (Signed)
Remote monitoring is used to monitor your pacemaker from home. This monitoring reduces the number of office visits required to check your device to one time per year. It allows [place] to keep an eye on the functioning of your device to ensure it is working properly. You are scheduled for a device check from home on 06-30-2014. You may send your transmission at any time that day. If you have a wireless device, the transmission will be sent automatically. After your physician reviews your transmission, you will receive a postcard with your next transmission date.  Your physician recommends that you schedule a follow-up appointment in: 12 months with Dr.Croitoru

## 2014-03-31 ENCOUNTER — Encounter: Payer: Self-pay | Admitting: Cardiovascular Disease

## 2014-03-31 NOTE — Progress Notes (Signed)
Patient ID: [name], female   DOB: July 18, 1945, 68 y.o.   MRN: [id number]      Reason for office visit Pacemaker, neurocardiogenic syncope, SVT  Tiffany Baird has done well since her last appointment, without new syncope or other cardiac complaints. She does not routinely check her BP. Her BP today is unusually high.Her pacemaker was implanted in 2009 after she had a detected 6.7 second pause on an event monitor (retrospectively, this was very likely a consequence of a neurally mediated cardioinhibitory event). After implantation of her pacemaker, atrial tachycardia and brief nonsustained ventricular tachycardia were recorded by her device but were not associated with symptoms. Rate-drop feature was activated on her device last year. Only one episode of atrial tachycardia and not true VT recorded in the last year.   Allergies  Allergen Reactions  . Codeine   . Hydrocodone   . Sumycin [Tetracycline]   . Tegretol [Carbamazepine]     Current Outpatient Prescriptions  Medication Sig Dispense Refill  . calcium carbonate (OS-CAL) 600 MG TABS tablet Take 600 mg by mouth daily.    . cholecalciferol (VITAMIN D) 1000 UNITS tablet Take 1,000 Units by mouth daily.    Marland Kitchen co-enzyme Q-10 30 MG capsule Take 200 mg by mouth daily.     . metoprolol (LOPRESSOR) 50 MG tablet Take 50 mg by mouth daily.    . Multiple Vitamin (MULTIVITAMIN) tablet Take 1 tablet by mouth daily.     No current facility-administered medications for this visit.    Past Medical History  Diagnosis Date  . Hx of syncope     X 18 years  . Sinus arrest 2009    6.7 sec pause with hx syncope  . RBBB   . Hx of cardiac pacemaker 01/14/08    medtronic adapta  . NSVT (nonsustained ventricular tachycardia)     on pacer checks  . PSVT (paroxysmal supraventricular tachycardia)     on pacer checks  . H/O echocardiogram 03/27/2011    EF >55%, mild MR, mild TR, RV systolic pressure 37-85YIFO,   . H/O cardiovascular stress test  03/27/2011    EF 86%, no ischemia  . GERD (gastroesophageal reflux disease)   . HTN (hypertension)   . H/O: rheumatic fever     as a child    Past Surgical History  Procedure Laterality Date  . Pacemaker insertion  01/14/2008    Medtronic   . Cholecystectomy    . Dental implants    . Fractured leg    . US echocardiography  03/27/11    mild TR, no significant valvular disease.  Marland Kitchen Nm myocar perf wall motion  03/27/11    normal    Family History  Problem Relation Age of Onset  . Cancer Mother   . Diabetes Mother   . Heart disease Father   . Diabetes Brother     History   Social History  . Marital Status: Married    Spouse Name: N/A    Number of Children: N/A  . Years of Education: N/A   Occupational History  . Not on file.   Social History Main Topics  . Smoking status: Never Smoker   . Smokeless tobacco: Never Used  . Alcohol Use: 2.4 oz/week    4 Glasses of wine per week  . Drug Use: No  . Sexual Activity: Not on file   Other Topics Concern  . Not on file   Social History Narrative    Review of systems: The  patient specifically denies any chest pain at rest or with exertion, dyspnea at rest or with exertion, orthopnea, paroxysmal nocturnal dyspnea, syncope, palpitations, focal neurological deficits, intermittent claudication, lower extremity edema, unexplained weight gain, cough, hemoptysis or wheezing.  The patient also denies abdominal pain, nausea, vomiting, dysphagia, diarrhea, constipation, polyuria, polydipsia, dysuria, hematuria, frequency, urgency, abnormal bleeding or bruising, fever, chills, unexpected weight changes, mood swings, change in skin or hair texture, change in voice quality, auditory or visual problems, allergic reactions or rashes, new musculoskeletal complaints other than usual "aches and pains".   PHYSICAL EXAM BP 140/100 mmHg  Pulse 71  Ht 5' 5" (1.651 m)  Wt 206 lb 8 oz (93.668 kg)  BMI 34.36 kg/m2  General: Alert, oriented  x3, no distress Head: no evidence of trauma, PERRL, EOMI, no exophtalmos or lid lag, no myxedema, no xanthelasma; normal ears, nose and oropharynx Neck: normal jugular venous pulsations and no hepatojugular reflux; brisk carotid pulses without delay and no carotid bruits Chest: clear to auscultation, no signs of consolidation by percussion or palpation, normal fremitus, symmetrical and full respiratory excursions Cardiovascular: normal position and quality of the apical impulse, regular rhythm, normal first and wide-split second heart sounds, no murmurs, rubs or gallops Abdomen: no tenderness or distention, no masses by palpation, no abnormal pulsatility or arterial bruits, normal bowel sounds, no hepatosplenomegaly Extremities: no clubbing, cyanosis or edema; 2+ radial, ulnar and brachial pulses bilaterally; 2+ right femoral, posterior tibial and dorsalis pedis pulses; 2+ left femoral, posterior tibial and dorsalis pedis pulses; no subclavian or femoral bruits Neurological: grossly nonfocal   EKG: NSR, RBBB  Lipid Panel  No results found for: CHOL, TRIG, HDL, CHOLHDL, VLDL, LDLCALC, LDLDIRECT  BMET    Component Value Date/Time   NA 140 02/19/2013 1352   K 4.4 02/19/2013 1352   CL 104 02/19/2013 1352   CO2 29 02/19/2013 1352   GLUCOSE 111* 02/19/2013 1352   BUN 19 02/19/2013 1352   CREATININE 0.88 02/19/2013 1352   CREATININE 0.78 01/14/2008 1130   CALCIUM 9.8 02/19/2013 1352   GFRNONAA 68 02/19/2013 1352   GFRNONAA >60 01/14/2008 1130   GFRAA 79 02/19/2013 1352   GFRAA  01/14/2008 1130    >60        The eGFR has been calculated using the MDRD equation. This calculation has not been validated in all clinical     ASSESSMENT AND PLAN Hx of cardiac pacemaker Device function is normal.  The device was reportedly implanted for tachycardia bradycardia syndrome and post tachycardia pauses, but it appears more likely that she truly has vasovagal events and does not have true  conduction system disease. Atrial and ventricular pacing occur very rarely outside of "rate-drop". No permanent programming changes were made today.  Neurocardiogenic syncope She can still have vasodepressor syncope even with pacemaker in place. She is warned to avoid triggers of syncope. She should pay any attention to any prodromal symptoms such as the nausea, lightheadedness and diaphoresis that she recently experienced. She should immediately assume a supine position if these occur. She is to stay well hydrated and eat a relatively high sodium diet.  Atrial tachycardia Brief episodes of  atrial tachycardia do not appear to be clinically important.   HTN Unusually high BP today. Asked her to record home BP (she has home monitor) and send record via mychart.  Orders Placed This Encounter  Procedures  . Implantable device check  . EKG 12-Lead   No orders of the defined types were placed  in this encounter.    Holli Humbles, MD, [place] [phone] office [phone] pager

## 2014-04-22 ENCOUNTER — Encounter: Payer: Self-pay | Admitting: Cardiovascular Disease

## 2014-04-30 ENCOUNTER — Telehealth: Payer: Self-pay | Admitting: Internal Medicine

## 2014-04-30 NOTE — Telephone Encounter (Signed)
Rec'd from [facility] forward 30 pages to Dr. Olevia Perches

## 2014-05-04 ENCOUNTER — Telehealth: Payer: Self-pay | Admitting: Internal Medicine

## 2014-05-04 NOTE — Telephone Encounter (Signed)
Records received from Dr. Liliane Channel office. Pt is wanting to switch back to Dr. Olevia Perches because she switched Ins. Plans. Records placed on Dr. Nichola Sizer office for review

## 2014-05-11 ENCOUNTER — Telehealth: Payer: Self-pay | Admitting: Internal Medicine

## 2014-05-11 NOTE — Telephone Encounter (Signed)
Records received from Dr. Liliane Channel office and reviewed by Dr. Olevia Perches.  Dr. Olevia Perches declined to accept pt at this time and Pt was notified

## 2014-06-22 ENCOUNTER — Telehealth: Payer: Self-pay | Admitting: Cardiovascular Disease

## 2014-06-22 NOTE — Telephone Encounter (Signed)
Patient requested episode list to be faxed to her. Confirmed fax number with patient. Faxed.

## 2014-06-22 NOTE — Telephone Encounter (Signed)
Pt asked for you,wants you to call her. Pt says it is not urgent.

## 2014-06-30 ENCOUNTER — Encounter: Payer: Medicare HMO | Admitting: *Deleted

## 2014-06-30 ENCOUNTER — Telehealth: Payer: Self-pay | Admitting: Cardiology

## 2014-06-30 NOTE — Telephone Encounter (Signed)
LMOVM reminding pt to send remote transmission.   

## 2014-07-01 ENCOUNTER — Encounter: Payer: Self-pay | Admitting: Cardiology

## 2014-07-05 ENCOUNTER — Encounter: Payer: Self-pay | Admitting: Cardiovascular Disease

## 2014-07-05 ENCOUNTER — Ambulatory Visit (INDEPENDENT_AMBULATORY_CARE_PROVIDER_SITE_OTHER): Payer: Medicare HMO | Admitting: *Deleted

## 2014-07-05 DIAGNOSIS — I495 Sick sinus syndrome: Secondary | ICD-10-CM | POA: Diagnosis not present

## 2014-07-05 LAB — MDC_IDC_ENUM_SESS_TYPE_REMOTE
Battery Impedance: 293 Ohm
Battery Remaining Longevity: 134 mo
Battery Voltage: 2.79 V
Brady Statistic AP VP Percent: 0 %
Brady Statistic AS VP Percent: 0 %
Date Time Interrogation Session: 20160321095039
Lead Channel Impedance Value: 514 Ohm
Lead Channel Pacing Threshold Amplitude: 0.875 V
Lead Channel Pacing Threshold Pulse Width: 0.4 ms
Lead Channel Pacing Threshold Pulse Width: 0.4 ms
Lead Channel Sensing Intrinsic Amplitude: 5.6 mV
Lead Channel Setting Pacing Amplitude: 2 V
Lead Channel Setting Pacing Amplitude: 2 V
MDC IDC MSMT LEADCHNL RA IMPEDANCE VALUE: 490 Ohm
MDC IDC MSMT LEADCHNL RA PACING THRESHOLD AMPLITUDE: 1 V
MDC IDC MSMT LEADCHNL RA SENSING INTR AMPL: 1.4 mV
MDC IDC SET LEADCHNL RV PACING PULSEWIDTH: 0.4 ms
MDC IDC SET LEADCHNL RV SENSING SENSITIVITY: 2.8 mV
MDC IDC STAT BRADY AP VS PERCENT: 0 %
MDC IDC STAT BRADY AS VS PERCENT: 100 %

## 2014-07-09 NOTE — Progress Notes (Signed)
Remote pacemaker transmission.   

## 2014-07-15 ENCOUNTER — Encounter: Payer: Self-pay | Admitting: Cardiology

## 2014-10-06 ENCOUNTER — Encounter: Payer: Self-pay | Admitting: Cardiovascular Disease

## 2014-10-06 ENCOUNTER — Ambulatory Visit (INDEPENDENT_AMBULATORY_CARE_PROVIDER_SITE_OTHER): Payer: Medicare HMO | Admitting: *Deleted

## 2014-10-06 ENCOUNTER — Telehealth: Payer: Self-pay | Admitting: Cardiology

## 2014-10-06 DIAGNOSIS — I495 Sick sinus syndrome: Secondary | ICD-10-CM

## 2014-10-06 NOTE — Telephone Encounter (Signed)
LMOVM reminding pt to send remote transmission.   

## 2014-10-07 NOTE — Progress Notes (Signed)
Remote pacemaker transmission.   

## 2014-10-10 LAB — CUP PACEART REMOTE DEVICE CHECK
Battery Impedance: 317 Ohm
Battery Remaining Longevity: 132 mo
Battery Voltage: 2.79 V
Brady Statistic AP VP Percent: 0 %
Brady Statistic AP VS Percent: 0 %
Brady Statistic AS VP Percent: 0 %
Brady Statistic AS VS Percent: 100 %
Lead Channel Impedance Value: 535 Ohm
Lead Channel Pacing Threshold Amplitude: 0.875 V
Lead Channel Pacing Threshold Pulse Width: 0.4 ms
Lead Channel Pacing Threshold Pulse Width: 0.4 ms
Lead Channel Setting Pacing Amplitude: 1.75 V
Lead Channel Setting Pacing Pulse Width: 0.4 ms
Lead Channel Setting Sensing Sensitivity: 2.8 mV
MDC IDC MSMT LEADCHNL RA IMPEDANCE VALUE: 475 Ohm
MDC IDC MSMT LEADCHNL RA PACING THRESHOLD AMPLITUDE: 0.875 V
MDC IDC MSMT LEADCHNL RA SENSING INTR AMPL: 2 mV
MDC IDC MSMT LEADCHNL RV SENSING INTR AMPL: 8 mV
MDC IDC SESS DTM: 20160622195348
MDC IDC SET LEADCHNL RV PACING AMPLITUDE: 2 V

## 2014-10-11 ENCOUNTER — Other Ambulatory Visit: Payer: Self-pay

## 2014-10-14 ENCOUNTER — Encounter: Payer: Self-pay | Admitting: Cardiology

## 2014-10-28 ENCOUNTER — Encounter: Payer: Self-pay | Admitting: Cardiology

## 2015-01-10 ENCOUNTER — Encounter: Payer: Self-pay | Admitting: Cardiovascular Disease

## 2015-01-10 ENCOUNTER — Ambulatory Visit (INDEPENDENT_AMBULATORY_CARE_PROVIDER_SITE_OTHER): Payer: Medicare HMO | Admitting: *Deleted

## 2015-01-10 DIAGNOSIS — I495 Sick sinus syndrome: Secondary | ICD-10-CM | POA: Diagnosis not present

## 2015-01-11 LAB — CUP PACEART REMOTE DEVICE CHECK
Battery Impedance: 365 Ohm
Battery Voltage: 2.79 V
Brady Statistic AP VP Percent: 0 %
Brady Statistic AP VS Percent: 0 %
Brady Statistic AS VP Percent: 0 %
Lead Channel Impedance Value: 470 Ohm
Lead Channel Impedance Value: 522 Ohm
Lead Channel Pacing Threshold Amplitude: 1 V
Lead Channel Pacing Threshold Pulse Width: 0.4 ms
Lead Channel Sensing Intrinsic Amplitude: 1.4 mV
Lead Channel Setting Pacing Amplitude: 2 V
Lead Channel Setting Sensing Sensitivity: 2.8 mV
MDC IDC MSMT BATTERY REMAINING LONGEVITY: 125 mo
MDC IDC MSMT LEADCHNL RV PACING THRESHOLD AMPLITUDE: 0.875 V
MDC IDC MSMT LEADCHNL RV PACING THRESHOLD PULSEWIDTH: 0.4 ms
MDC IDC MSMT LEADCHNL RV SENSING INTR AMPL: 5.6 mV
MDC IDC SESS DTM: 20160926132739
MDC IDC SET LEADCHNL RV PACING AMPLITUDE: 2 V
MDC IDC SET LEADCHNL RV PACING PULSEWIDTH: 0.4 ms
MDC IDC STAT BRADY AS VS PERCENT: 100 %

## 2015-01-11 NOTE — Progress Notes (Signed)
Remote pacemaker transmission.   

## 2015-01-19 ENCOUNTER — Encounter: Payer: Self-pay | Admitting: Cardiology

## 2015-02-16 DIAGNOSIS — Z23 Encounter for immunization: Secondary | ICD-10-CM | POA: Diagnosis not present

## 2015-04-05 ENCOUNTER — Encounter: Payer: Self-pay | Admitting: Cardiovascular Disease

## 2015-04-05 ENCOUNTER — Ambulatory Visit (INDEPENDENT_AMBULATORY_CARE_PROVIDER_SITE_OTHER): Payer: Medicare HMO | Admitting: Cardiovascular Disease

## 2015-04-05 VITALS — BP 132/76 | HR 72 | Ht 64.5 in | Wt 211.0 lb

## 2015-04-05 DIAGNOSIS — Z8679 Personal history of other diseases of the circulatory system: Secondary | ICD-10-CM | POA: Diagnosis not present

## 2015-04-05 DIAGNOSIS — I472 Ventricular tachycardia: Secondary | ICD-10-CM

## 2015-04-05 DIAGNOSIS — I10 Essential (primary) hypertension: Secondary | ICD-10-CM

## 2015-04-05 DIAGNOSIS — R55 Syncope and collapse: Secondary | ICD-10-CM

## 2015-04-05 DIAGNOSIS — Z950 Presence of cardiac pacemaker: Secondary | ICD-10-CM

## 2015-04-05 DIAGNOSIS — I471 Supraventricular tachycardia: Secondary | ICD-10-CM

## 2015-04-05 DIAGNOSIS — I4729 Other ventricular tachycardia: Secondary | ICD-10-CM

## 2015-04-05 NOTE — Progress Notes (Signed)
Patient ID: Tiffany Baird, female   DOB: 1945-10-17, 69 y.o.   MRN: [id number]    Cardiology Office Note    Date:  04/07/2015   ID:  Tiffany Baird, DOB 10-30-1945, MRN [id number]  PCP:  Horatio Pel, MD  Cardiologist:   Sanda Klein, MD   Chief Complaint  Patient presents with  . Follow-up    no chest pain, no shortness of breath, no edema, has cramping in legs occassional, no lightheaded or dizziness    History of Present Illness:  Tiffany Baird is a 69 y.o. female with a history of sinus pause probably related to neurocardiogenic syncope, without recurrence of syncope after implantation of a dual-chamber permanent pacemaker in 69 year old she does not have any known structural heart disease by previous noninvasive testing performed in 2012. She very infrequently requires pacing, confirming the absence of true sinus node dysfunction.  Her dual-chamber Medtronic Adapta pacemaker was implanted in 2009 and still has roughly 10-1/2 years of remaining battery longevity. There is virtually no atrial or ventricular pacing. Occasional episodes of hives rates are recorded most of which are clearly supraventricular tachycardia. There may be one episode of nonsustained VT. The maximum duration of tachycardia is 7 seconds.  She feels great, denies any cardiovascular complaints and is planning a snowmobiling and ice fishing trip to [place] this winter. She hopes to see the [employer].    Past Medical History  Diagnosis Date  . Hx of syncope     X 18 years  . Sinus arrest 2009    6.7 sec pause with hx syncope  . RBBB   . Hx of cardiac pacemaker 01/14/08    medtronic adapta  . NSVT (nonsustained ventricular tachycardia) (HCC)     on pacer checks  . PSVT (paroxysmal supraventricular tachycardia) (HCC)     on pacer checks  . H/O echocardiogram 03/27/2011    EF >55%, mild MR, mild TR, RV systolic pressure 99991111,   . H/O cardiovascular stress test 03/27/2011    EF  86%, no ischemia  . GERD (gastroesophageal reflux disease)   . HTN (hypertension)   . H/O: rheumatic fever     as a child    Past Surgical History  Procedure Laterality Date  . Pacemaker insertion  01/14/2008    Medtronic   . Cholecystectomy    . Dental implants    . Fractured leg    . US echocardiography  03/27/11    mild TR, no significant valvular disease.  Marland Kitchen Nm myocar perf wall motion  03/27/11    normal    Current Outpatient Prescriptions  Medication Sig Dispense Refill  . calcium carbonate (OS-CAL) 600 MG TABS tablet Take 600 mg by mouth daily.    . cholecalciferol (VITAMIN D) 1000 UNITS tablet Take 1,000 Units by mouth daily.    Marland Kitchen co-enzyme Q-10 30 MG capsule Take 200 mg by mouth daily.     . metoprolol succinate (TOPROL-XL) 50 MG 24 hr tablet Take 0.5 mg by mouth 2 (two) times daily. Take half in the morning and half at night daily    . Multiple Vitamin (MULTIVITAMIN) tablet Take 1 tablet by mouth daily.    . Multiple Vitamins-Minerals (PRESERVISION AREDS) CAPS Take 1 capsule by mouth daily.     No current facility-administered medications for this visit.    Allergies:   Codeine; Hydrocodone; Sumycin; and Tegretol   Social History   Social History  . Marital Status: Married    Spouse Name: N/A  .  Number of Children: N/A  . Years of Education: N/A   Social History Main Topics  . Smoking status: Never Smoker   . Smokeless tobacco: Never Used  . Alcohol Use: 2.4 oz/week    4 Glasses of wine per week  . Drug Use: No  . Sexual Activity: Not Asked   Other Topics Concern  . None   Social History Narrative     Family History:  The patient's family history includes Cancer in her mother; Diabetes in her brother and mother; Heart disease in her father.   ROS:   Please see the history of present illness.    Review of Systems  All other systems reviewed and are negative.    PHYSICAL EXAM:   VS:  BP 132/76 mmHg  Pulse 72  Ht 5' 4.5" (1.638 m)  Wt 211 lb  (95.709 kg)  BMI 35.67 kg/m2   GEN: Well nourished, well developed, in no acute distress HEENT: normal Neck: no JVD, carotid bruits, or masses Cardiac: RRR; no murmurs, rubs, or gallops,no edema ; also left subclavian pacemaker site Respiratory:  clear to auscultation bilaterally, normal work of breathing GI: soft, nontender, nondistended, + BS MS: no deformity or atrophy Skin: warm and dry, no rash Neuro:  Alert and Oriented x 3, Strength and sensation are intact Psych: euthymic mood, full affect  Wt Readings from Last 3 Encounters:  04/05/15 211 lb (95.709 kg)  03/30/14 206 lb 8 oz (93.668 kg)  03/31/13 190 lb 1.6 oz (86.229 kg)      Studies/Labs Reviewed:   EKG:  EKG is ordered today.  The ekg ordered today demonstrates normal sinus rhythm, old right bundle branch block   ASSESSMENT:    1. Neurocardiogenic syncope   2. NSVT (nonsustained ventricular tachycardia) ([place])   3. PSVT (paroxysmal supraventricular tachycardia) ([place])   4. Hx of cardiac pacemaker   5. Essential hypertension      PLAN:  In order of problems listed above:  1. No recurrence of syncope since pacemaker implantation 2. In the absence of structural heart disease her brief episodes of asymptomatic tachyarrhythmia are not likely to be clinically important 3. as above 4. Normally functioning dual-chamber permanent pacemaker, [employer] remote downloads every 3 months and yearly office visit. 5. Well-controlled blood pressure on a low dose of beta blocker   Medication Adjustments/Labs and Tests Ordered: Current medicines are reviewed at length with the patient today.  Concerns regarding medicines are outlined above.  Medication changes, Labs and Tests ordered today are listed below. Patient Instructions  Remote monitoring is used to monitor your Pacemaker or ICD from home. This monitoring reduces the number of office visits required to check your device to one time per year. It allows [place] to monitor the  functioning of your device to ensure it is working properly. You are scheduled for a device check from home on July 05, 2015. You may send your transmission at any time that day. If you have a wireless device, the transmission will be sent automatically. After your physician reviews your transmission, you will receive a postcard with your next transmission date.  Dr. Sallyanne Kuster recommends that you schedule a follow-up appointment in: [place] ([place]).        Mikael Spray, MD  04/07/2015 8:31 AM    [facility] [place], [place], [place]  [address] Phone: [phone]; Fax: [phone]

## 2015-04-05 NOTE — Patient Instructions (Signed)
Remote monitoring is used to monitor your Pacemaker or ICD from home. This monitoring reduces the number of office visits required to check your device to one time per year. It allows [place] to monitor the functioning of your device to ensure it is working properly. You are scheduled for a device check from home on July 05, 2015. You may send your transmission at any time that day. If you have a wireless device, the transmission will be sent automatically. After your physician reviews your transmission, you will receive a postcard with your next transmission date.  Dr. Sallyanne Kuster recommends that you schedule a follow-up appointment in: [place] ([place]).

## 2015-04-13 LAB — CUP PACEART INCLINIC DEVICE CHECK
Brady Statistic AP VS Percent: 0 %
Date Time Interrogation Session: 20161220143254
Implantable Lead Implant Date: 20090930
Implantable Lead Location: 753859
Implantable Lead Model: 5076
Lead Channel Impedance Value: 470 Ohm
Lead Channel Impedance Value: 534 Ohm
Lead Channel Setting Pacing Pulse Width: 0.4 ms
MDC IDC LEAD IMPLANT DT: 20090930
MDC IDC LEAD LOCATION: 753860
MDC IDC MSMT BATTERY IMPEDANCE: 364 Ohm
MDC IDC MSMT BATTERY REMAINING LONGEVITY: 126 mo
MDC IDC MSMT BATTERY VOLTAGE: 2.79 V
MDC IDC SET LEADCHNL RA PACING AMPLITUDE: 2 V
MDC IDC SET LEADCHNL RV PACING AMPLITUDE: 2 V
MDC IDC SET LEADCHNL RV SENSING SENSITIVITY: 2 mV
MDC IDC STAT BRADY AP VP PERCENT: 0 %
MDC IDC STAT BRADY AS VP PERCENT: 0 %
MDC IDC STAT BRADY AS VS PERCENT: 100 %

## 2015-07-05 ENCOUNTER — Ambulatory Visit (INDEPENDENT_AMBULATORY_CARE_PROVIDER_SITE_OTHER): Payer: Medicare HMO | Admitting: *Deleted

## 2015-07-05 ENCOUNTER — Telehealth: Payer: Self-pay | Admitting: Cardiology

## 2015-07-05 DIAGNOSIS — I495 Sick sinus syndrome: Secondary | ICD-10-CM

## 2015-07-05 NOTE — Telephone Encounter (Signed)
Spoke with pt and reminded pt of remote transmission that is due today. Pt verbalized understanding.   

## 2015-07-06 NOTE — Progress Notes (Signed)
Remote pacemaker transmission.   

## 2015-08-04 LAB — CUP PACEART REMOTE DEVICE CHECK
Battery Impedance: 390 Ohm
Battery Remaining Longevity: 123 mo
Brady Statistic AP VP Percent: 0 %
Brady Statistic AS VP Percent: 0 %
Brady Statistic AS VS Percent: 100 %
Implantable Lead Implant Date: 20090930
Implantable Lead Implant Date: 20090930
Implantable Lead Location: 753859
Lead Channel Impedance Value: 483 Ohm
Lead Channel Pacing Threshold Amplitude: 0.625 V
Lead Channel Pacing Threshold Amplitude: 1 V
Lead Channel Pacing Threshold Pulse Width: 0.4 ms
Lead Channel Sensing Intrinsic Amplitude: 1.4 mV
Lead Channel Sensing Intrinsic Amplitude: 5.6 mV
Lead Channel Setting Pacing Amplitude: 2 V
Lead Channel Setting Pacing Amplitude: 2 V
Lead Channel Setting Pacing Pulse Width: 0.4 ms
MDC IDC LEAD LOCATION: 753860
MDC IDC MSMT BATTERY VOLTAGE: 2.8 V
MDC IDC MSMT LEADCHNL RV IMPEDANCE VALUE: 554 Ohm
MDC IDC MSMT LEADCHNL RV PACING THRESHOLD PULSEWIDTH: 0.4 ms
MDC IDC SESS DTM: 20170321233033
MDC IDC SET LEADCHNL RV SENSING SENSITIVITY: 2.8 mV
MDC IDC STAT BRADY AP VS PERCENT: 0 %

## 2015-08-05 ENCOUNTER — Encounter: Payer: Self-pay | Admitting: Cardiology

## 2015-08-19 ENCOUNTER — Encounter: Payer: Self-pay | Admitting: Cardiology

## 2015-10-04 ENCOUNTER — Ambulatory Visit (INDEPENDENT_AMBULATORY_CARE_PROVIDER_SITE_OTHER): Payer: Medicare HMO | Admitting: *Deleted

## 2015-10-04 ENCOUNTER — Telehealth: Payer: Self-pay | Admitting: Cardiology

## 2015-10-04 DIAGNOSIS — I495 Sick sinus syndrome: Secondary | ICD-10-CM

## 2015-10-04 NOTE — Telephone Encounter (Signed)
Spoke with pt and reminded pt of remote transmission that is due today. Pt verbalized understanding.   

## 2015-10-04 NOTE — Progress Notes (Signed)
Remote pacemaker transmission.   

## 2015-10-05 LAB — CUP PACEART REMOTE DEVICE CHECK
Battery Remaining Longevity: 120 mo
Battery Voltage: 2.79 V
Date Time Interrogation Session: 20170620142457
Implantable Lead Implant Date: 20090930
Implantable Lead Location: 753859
Lead Channel Impedance Value: 483 Ohm
Lead Channel Pacing Threshold Pulse Width: 0.4 ms
Lead Channel Pacing Threshold Pulse Width: 0.4 ms
Lead Channel Setting Pacing Amplitude: 2 V
Lead Channel Setting Pacing Amplitude: 2 V
Lead Channel Setting Pacing Pulse Width: 0.4 ms
MDC IDC LEAD IMPLANT DT: 20090930
MDC IDC LEAD LOCATION: 753860
MDC IDC MSMT BATTERY IMPEDANCE: 413 Ohm
MDC IDC MSMT LEADCHNL RA PACING THRESHOLD AMPLITUDE: 1 V
MDC IDC MSMT LEADCHNL RA SENSING INTR AMPL: 1.4 mV
MDC IDC MSMT LEADCHNL RV IMPEDANCE VALUE: 556 Ohm
MDC IDC MSMT LEADCHNL RV PACING THRESHOLD AMPLITUDE: 0.625 V
MDC IDC MSMT LEADCHNL RV SENSING INTR AMPL: 5.6 mV
MDC IDC SET LEADCHNL RV SENSING SENSITIVITY: 2.8 mV
MDC IDC STAT BRADY AP VP PERCENT: 0 %
MDC IDC STAT BRADY AP VS PERCENT: 0 %
MDC IDC STAT BRADY AS VP PERCENT: 0 %
MDC IDC STAT BRADY AS VS PERCENT: 100 %

## 2015-10-07 ENCOUNTER — Encounter: Payer: Self-pay | Admitting: Cardiology

## 2015-10-21 ENCOUNTER — Encounter: Payer: Self-pay | Admitting: Cardiology

## 2015-11-07 DIAGNOSIS — K635 Polyp of colon: Secondary | ICD-10-CM | POA: Diagnosis not present

## 2015-11-07 DIAGNOSIS — I10 Essential (primary) hypertension: Secondary | ICD-10-CM | POA: Diagnosis not present

## 2015-11-07 DIAGNOSIS — Z0000 Encounter for general adult medical examination without abnormal findings: Secondary | ICD-10-CM | POA: Diagnosis not present

## 2015-11-07 DIAGNOSIS — M722 Plantar fascial fibromatosis: Secondary | ICD-10-CM | POA: Diagnosis not present

## 2015-11-07 DIAGNOSIS — E559 Vitamin D deficiency, unspecified: Secondary | ICD-10-CM | POA: Diagnosis not present

## 2015-11-07 DIAGNOSIS — E7800 Pure hypercholesterolemia, unspecified: Secondary | ICD-10-CM | POA: Diagnosis not present

## 2015-11-07 DIAGNOSIS — K219 Gastro-esophageal reflux disease without esophagitis: Secondary | ICD-10-CM | POA: Diagnosis not present

## 2015-11-07 DIAGNOSIS — M810 Age-related osteoporosis without current pathological fracture: Secondary | ICD-10-CM | POA: Diagnosis not present

## 2015-11-11 ENCOUNTER — Other Ambulatory Visit: Payer: Self-pay | Admitting: Obstetrics & Gynecology

## 2015-11-11 DIAGNOSIS — Z1231 Encounter for screening mammogram for malignant neoplasm of breast: Secondary | ICD-10-CM

## 2015-11-14 DIAGNOSIS — I471 Supraventricular tachycardia: Secondary | ICD-10-CM | POA: Diagnosis not present

## 2015-11-14 DIAGNOSIS — Z0000 Encounter for general adult medical examination without abnormal findings: Secondary | ICD-10-CM | POA: Diagnosis not present

## 2015-11-14 DIAGNOSIS — K219 Gastro-esophageal reflux disease without esophagitis: Secondary | ICD-10-CM | POA: Diagnosis not present

## 2015-11-14 DIAGNOSIS — I10 Essential (primary) hypertension: Secondary | ICD-10-CM | POA: Diagnosis not present

## 2015-11-17 ENCOUNTER — Ambulatory Visit: Payer: Medicare HMO

## 2015-11-28 ENCOUNTER — Ambulatory Visit
Admission: RE | Admit: 2015-11-28 | Discharge: 2015-11-28 | Disposition: A | Discharge status: Home / Self Care | Payer: Medicare HMO | Source: Ambulatory Visit | Attending: Obstetrics & Gynecology | Admitting: Obstetrics & Gynecology

## 2015-11-28 DIAGNOSIS — Z1231 Encounter for screening mammogram for malignant neoplasm of breast: Secondary | ICD-10-CM | POA: Diagnosis not present

## 2015-11-30 DIAGNOSIS — Z01419 Encounter for gynecological examination (general) (routine) without abnormal findings: Secondary | ICD-10-CM | POA: Diagnosis not present

## 2016-01-03 ENCOUNTER — Telehealth: Payer: Self-pay | Admitting: Cardiology

## 2016-01-03 ENCOUNTER — Ambulatory Visit (INDEPENDENT_AMBULATORY_CARE_PROVIDER_SITE_OTHER): Payer: Medicare HMO | Admitting: *Deleted

## 2016-01-03 DIAGNOSIS — I495 Sick sinus syndrome: Secondary | ICD-10-CM | POA: Diagnosis not present

## 2016-01-03 NOTE — Telephone Encounter (Signed)
LMOVM reminding pt to send remote transmission.   

## 2016-01-03 NOTE — Progress Notes (Signed)
Remote pacemaker transmission.   

## 2016-01-04 ENCOUNTER — Encounter: Payer: Self-pay | Admitting: Cardiology

## 2016-01-23 LAB — CUP PACEART REMOTE DEVICE CHECK
Battery Impedance: 460 Ohm
Battery Remaining Longevity: 115 mo
Brady Statistic AP VP Percent: 0 %
Date Time Interrogation Session: 20170919183225
Implantable Lead Implant Date: 20090930
Implantable Lead Model: 5076
Lead Channel Pacing Threshold Amplitude: 0.625 V
Lead Channel Pacing Threshold Amplitude: 1 V
Lead Channel Sensing Intrinsic Amplitude: 1.4 mV
Lead Channel Setting Pacing Amplitude: 2 V
Lead Channel Setting Pacing Pulse Width: 0.4 ms
MDC IDC LEAD IMPLANT DT: 20090930
MDC IDC LEAD LOCATION: 753859
MDC IDC LEAD LOCATION: 753860
MDC IDC MSMT BATTERY VOLTAGE: 2.79 V
MDC IDC MSMT LEADCHNL RA IMPEDANCE VALUE: 469 Ohm
MDC IDC MSMT LEADCHNL RA PACING THRESHOLD PULSEWIDTH: 0.4 ms
MDC IDC MSMT LEADCHNL RV IMPEDANCE VALUE: 485 Ohm
MDC IDC MSMT LEADCHNL RV PACING THRESHOLD PULSEWIDTH: 0.4 ms
MDC IDC MSMT LEADCHNL RV SENSING INTR AMPL: 5.6 mV
MDC IDC SET LEADCHNL RA PACING AMPLITUDE: 2 V
MDC IDC SET LEADCHNL RV SENSING SENSITIVITY: 2.8 mV
MDC IDC STAT BRADY AP VS PERCENT: 0 %
MDC IDC STAT BRADY AS VP PERCENT: 0 %
MDC IDC STAT BRADY AS VS PERCENT: 100 %

## 2016-01-31 DIAGNOSIS — R69 Illness, unspecified: Secondary | ICD-10-CM | POA: Diagnosis not present

## 2016-02-15 DIAGNOSIS — B001 Herpesviral vesicular dermatitis: Secondary | ICD-10-CM | POA: Diagnosis not present

## 2016-02-15 DIAGNOSIS — R51 Headache: Secondary | ICD-10-CM | POA: Diagnosis not present

## 2016-02-20 ENCOUNTER — Other Ambulatory Visit: Payer: Self-pay | Admitting: Internal Medicine

## 2016-02-20 DIAGNOSIS — R51 Headache: Principal | ICD-10-CM

## 2016-02-20 DIAGNOSIS — R519 Headache, unspecified: Secondary | ICD-10-CM

## 2016-02-21 DIAGNOSIS — I10 Essential (primary) hypertension: Secondary | ICD-10-CM | POA: Diagnosis not present

## 2016-02-21 DIAGNOSIS — G43009 Migraine without aura, not intractable, without status migrainosus: Secondary | ICD-10-CM | POA: Diagnosis not present

## 2016-02-27 ENCOUNTER — Ambulatory Visit
Admission: RE | Admit: 2016-02-27 | Discharge: 2016-02-27 | Disposition: A | Discharge status: Home / Self Care | Payer: Medicare HMO | Source: Ambulatory Visit | Attending: Internal Medicine | Admitting: Internal Medicine

## 2016-02-27 DIAGNOSIS — R51 Headache: Secondary | ICD-10-CM | POA: Diagnosis not present

## 2016-02-27 DIAGNOSIS — R519 Headache, unspecified: Secondary | ICD-10-CM

## 2016-04-22 DIAGNOSIS — R69 Illness, unspecified: Secondary | ICD-10-CM | POA: Diagnosis not present

## 2016-04-25 ENCOUNTER — Telehealth: Payer: Self-pay | Admitting: *Deleted

## 2016-04-25 ENCOUNTER — Encounter: Payer: Self-pay | Admitting: Cardiovascular Disease

## 2016-04-25 ENCOUNTER — Telehealth: Payer: Self-pay | Admitting: Cardiovascular Disease

## 2016-04-25 DIAGNOSIS — S82841A Displaced bimalleolar fracture of right lower leg, initial encounter for closed fracture: Secondary | ICD-10-CM | POA: Diagnosis not present

## 2016-04-25 DIAGNOSIS — M25571 Pain in right ankle and joints of right foot: Secondary | ICD-10-CM | POA: Diagnosis not present

## 2016-04-25 NOTE — Telephone Encounter (Signed)
New message       Request for surgical clearance:  1. What type of surgery is being performed?  ankle fracture  2. When is this surgery scheduled?  Pending clearance---want to do it next week  Are there any medications that need to be held prior to surgery and how long?  Cardiac clearance to be put to sleep Name of physician performing surgery? Dr  Stann Mainland 3. What is your office phone and fax number?  Fax [phone] attn Hassan Buckler

## 2016-04-25 NOTE — Telephone Encounter (Signed)
Advised patient

## 2016-04-25 NOTE — Telephone Encounter (Signed)
Patient came in office today and wanted to let Dr Sallyanne Kuster know that she was seeing an orthopedist today. She was in [facility] and fell on ice. She went to the [facility] felt she needed surgery on her fractured ankle and possible leg but needed cardiac clearance. She is seeing Dr Gladstone Lighter today at 3:15.  Will forward to Dr Sallyanne Kuster so that he will be aware

## 2016-04-25 NOTE — Telephone Encounter (Signed)
Left message to call back  

## 2016-04-25 NOTE — Telephone Encounter (Signed)
Thank you. If she needs surgery, I do not foresee a problem cardiac-wise. She is not pacemaker dependent and leg-ankle surgery should not interfere with her device. MCr

## 2016-04-25 NOTE — Telephone Encounter (Signed)
Sent via epic 

## 2016-04-25 NOTE — Telephone Encounter (Signed)
Clearance routed to MD Croitoru.

## 2016-04-27 ENCOUNTER — Encounter (HOSPITAL_COMMUNITY): Payer: Self-pay | Admitting: *Deleted

## 2016-04-27 NOTE — Progress Notes (Addendum)
Mrs Menacho denies any chest pain or shprtness of breath. Patient reports a history of "passing out after every surgical procedure and colonoscopy" she has had. Pacemaker was inserted in 2009, patient passed out after this procedure, patient is not sure if she has had another colonoscopy since the pacemaker, the pacemaker insertion was the last surgical procedure she has had.  I spoke with Tomi Bamberger from [employer], she received message.

## 2016-04-30 ENCOUNTER — Encounter (HOSPITAL_COMMUNITY): Payer: Self-pay | Admitting: Certified Registered Nurse Anesthetist

## 2016-04-30 ENCOUNTER — Ambulatory Visit (HOSPITAL_COMMUNITY): Payer: Medicare HMO

## 2016-04-30 ENCOUNTER — Encounter (HOSPITAL_COMMUNITY)
Admission: RE | Disposition: A | Discharge status: Home / Self Care | Payer: Self-pay | Source: Ambulatory Visit | Attending: Orthopedic Surgery

## 2016-04-30 ENCOUNTER — Ambulatory Visit (HOSPITAL_COMMUNITY): Payer: Medicare HMO | Admitting: Certified Registered Nurse Anesthetist

## 2016-04-30 ENCOUNTER — Observation Stay (HOSPITAL_COMMUNITY)
Admission: RE | Admit: 2016-04-30 | Discharge: 2016-05-01 | Disposition: A | Discharge status: Home / Self Care | Payer: Medicare HMO | Source: Ambulatory Visit | Attending: Orthopedic Surgery | Admitting: Orthopedic Surgery

## 2016-04-30 DIAGNOSIS — G8918 Other acute postprocedural pain: Secondary | ICD-10-CM | POA: Diagnosis not present

## 2016-04-30 DIAGNOSIS — I081 Rheumatic disorders of both mitral and tricuspid valves: Secondary | ICD-10-CM | POA: Diagnosis not present

## 2016-04-30 DIAGNOSIS — S82841A Displaced bimalleolar fracture of right lower leg, initial encounter for closed fracture: Secondary | ICD-10-CM | POA: Diagnosis not present

## 2016-04-30 DIAGNOSIS — W19XXXA Unspecified fall, initial encounter: Secondary | ICD-10-CM | POA: Diagnosis not present

## 2016-04-30 DIAGNOSIS — Z888 Allergy status to other drugs, medicaments and biological substances status: Secondary | ICD-10-CM | POA: Insufficient documentation

## 2016-04-30 DIAGNOSIS — Z950 Presence of cardiac pacemaker: Secondary | ICD-10-CM | POA: Insufficient documentation

## 2016-04-30 DIAGNOSIS — I471 Supraventricular tachycardia: Secondary | ICD-10-CM | POA: Diagnosis not present

## 2016-04-30 DIAGNOSIS — Z419 Encounter for procedure for purposes other than remedying health state, unspecified: Secondary | ICD-10-CM

## 2016-04-30 DIAGNOSIS — I4510 Unspecified right bundle-branch block: Secondary | ICD-10-CM | POA: Diagnosis not present

## 2016-04-30 DIAGNOSIS — K219 Gastro-esophageal reflux disease without esophagitis: Secondary | ICD-10-CM | POA: Insufficient documentation

## 2016-04-30 DIAGNOSIS — Z885 Allergy status to narcotic agent status: Secondary | ICD-10-CM | POA: Diagnosis not present

## 2016-04-30 DIAGNOSIS — S82851A Displaced trimalleolar fracture of right lower leg, initial encounter for closed fracture: Principal | ICD-10-CM | POA: Insufficient documentation

## 2016-04-30 DIAGNOSIS — I10 Essential (primary) hypertension: Secondary | ICD-10-CM | POA: Insufficient documentation

## 2016-04-30 HISTORY — DX: Headache, unspecified: R51.9

## 2016-04-30 HISTORY — DX: Other complications of anesthesia, initial encounter: T88.59XA

## 2016-04-30 HISTORY — DX: Adverse effect of unspecified anesthetic, initial encounter: T41.45XA

## 2016-04-30 HISTORY — DX: Unspecified injury of head, initial encounter: S09.90XA

## 2016-04-30 HISTORY — DX: Headache: R51

## 2016-04-30 HISTORY — PX: ORIF ANKLE FRACTURE: SHX5408

## 2016-04-30 LAB — CBC
HCT: 42.8 % (ref 36.0–46.0)
Hemoglobin: 14.1 g/dL (ref 12.0–15.0)
MCH: 28.3 pg (ref 26.0–34.0)
MCHC: 32.9 g/dL (ref 30.0–36.0)
MCV: 85.8 fL (ref 78.0–100.0)
Platelets: 326 10*3/uL (ref 150–400)
RBC: 4.99 MIL/uL (ref 3.87–5.11)
RDW: 13.7 % (ref 11.5–15.5)
WBC: 7.6 10*3/uL (ref 4.0–10.5)

## 2016-04-30 LAB — BASIC METABOLIC PANEL
Anion gap: 12 (ref 5–15)
BUN: 24 mg/dL — AB (ref 6–20)
CHLORIDE: 104 mmol/L (ref 101–111)
CO2: 25 mmol/L (ref 22–32)
CREATININE: 0.75 mg/dL (ref 0.44–1.00)
Calcium: 9.8 mg/dL (ref 8.9–10.3)
GFR calc Af Amer: >60 mL/min (ref >60)
GFR calc non Af Amer: >60 mL/min (ref >60)
Glucose, Bld: 93 mg/dL (ref 65–99)
Potassium: 3.7 mmol/L (ref 3.5–5.1)
SODIUM: 141 mmol/L (ref 135–145)

## 2016-04-30 SURGERY — OPEN REDUCTION INTERNAL FIXATION (ORIF) ANKLE FRACTURE
Anesthesia: General | Laterality: Right

## 2016-04-30 MED ORDER — METHOCARBAMOL 500 MG PO TABS: 500.0000 mg | ORAL_TABLET | Freq: Four times a day (QID) | ORAL | Status: DC | PRN

## 2016-04-30 MED ORDER — OXYCODONE HCL 5 MG PO TABS: 5.0000 mg | ORAL_TABLET | ORAL | Status: DC | PRN

## 2016-04-30 MED ORDER — CHLORHEXIDINE GLUCONATE 4 % EX LIQD: 60.0000 mL | Freq: Once | CUTANEOUS | Status: DC

## 2016-04-30 MED ORDER — ACETAMINOPHEN 500 MG PO TABS
1000.0000 mg | ORAL_TABLET | Freq: Four times a day (QID) | ORAL | Status: DC
  Administered 2016-04-30 – 2016-05-01 (×5): 1000 mg via ORAL
  Filled 2016-04-30 (×5): qty 2

## 2016-04-30 MED ORDER — FENTANYL CITRATE (PF) 100 MCG/2ML IJ SOLN
INTRAMUSCULAR | Status: AC
  Filled 2016-04-30: qty 2

## 2016-04-30 MED ORDER — METHOCARBAMOL 1000 MG/10ML IJ SOLN
500.0000 mg | Freq: Four times a day (QID) | INTRAMUSCULAR | Status: DC | PRN
  Filled 2016-04-30: qty 5

## 2016-04-30 MED ORDER — PHENYLEPHRINE HCL 10 MG/ML IJ SOLN
INTRAMUSCULAR | Status: DC | PRN
  Administered 2016-04-30: 35 ug/min via INTRAVENOUS

## 2016-04-30 MED ORDER — DEXAMETHASONE SODIUM PHOSPHATE 4 MG/ML IJ SOLN
INTRAMUSCULAR | Status: DC | PRN
  Administered 2016-04-30: 8 mg via INTRAVENOUS

## 2016-04-30 MED ORDER — 0.9 % SODIUM CHLORIDE (POUR BTL) OPTIME
TOPICAL | Status: DC | PRN
  Administered 2016-04-30: 1000 mL

## 2016-04-30 MED ORDER — ONDANSETRON HCL 4 MG/2ML IJ SOLN: 4.0000 mg | Freq: Four times a day (QID) | INTRAMUSCULAR | Status: DC | PRN

## 2016-04-30 MED ORDER — LACTATED RINGERS IV SOLN
INTRAVENOUS | Status: DC
  Administered 2016-04-30 (×2): via INTRAVENOUS

## 2016-04-30 MED ORDER — ENOXAPARIN SODIUM 40 MG/0.4ML ~~LOC~~ SOLN
40.0000 mg | SUBCUTANEOUS | Status: DC
  Administered 2016-05-01: 40 mg via SUBCUTANEOUS
  Filled 2016-04-30: qty 0.4

## 2016-04-30 MED ORDER — PROPOFOL 10 MG/ML IV BOLUS
INTRAVENOUS | Status: AC
  Filled 2016-04-30: qty 20

## 2016-04-30 MED ORDER — ACETAMINOPHEN 650 MG RE SUPP
650.0000 mg | Freq: Four times a day (QID) | RECTAL | Status: DC | PRN
  Filled 2016-04-30: qty 1

## 2016-04-30 MED ORDER — ONDANSETRON HCL 4 MG/2ML IJ SOLN
INTRAMUSCULAR | Status: DC | PRN
  Administered 2016-04-30: 4 mg via INTRAVENOUS

## 2016-04-30 MED ORDER — CEFAZOLIN SODIUM-DEXTROSE 2-4 GM/100ML-% IV SOLN
2.0000 g | INTRAVENOUS | Status: AC
  Administered 2016-04-30: 2 g via INTRAVENOUS
  Filled 2016-04-30: qty 100

## 2016-04-30 MED ORDER — ACETAMINOPHEN 325 MG PO TABS
650.0000 mg | ORAL_TABLET | Freq: Four times a day (QID) | ORAL | Status: DC | PRN
  Filled 2016-04-30: qty 2

## 2016-04-30 MED ORDER — FENTANYL CITRATE (PF) 100 MCG/2ML IJ SOLN
INTRAMUSCULAR | Status: DC | PRN
  Administered 2016-04-30 (×4): 50 ug via INTRAVENOUS

## 2016-04-30 MED ORDER — LIDOCAINE HCL (CARDIAC) 20 MG/ML IV SOLN
INTRAVENOUS | Status: DC | PRN
  Administered 2016-04-30: 60 mg via INTRAVENOUS

## 2016-04-30 MED ORDER — ONDANSETRON HCL 4 MG PO TABS: 4.0000 mg | ORAL_TABLET | Freq: Four times a day (QID) | ORAL | Status: DC | PRN

## 2016-04-30 MED ORDER — DOCUSATE SODIUM 100 MG PO CAPS
100.0000 mg | ORAL_CAPSULE | Freq: Two times a day (BID) | ORAL | Status: DC
  Administered 2016-04-30 – 2016-05-01 (×2): 100 mg via ORAL
  Filled 2016-04-30 (×2): qty 1

## 2016-04-30 MED ORDER — MIDAZOLAM HCL 5 MG/5ML IJ SOLN
INTRAMUSCULAR | Status: DC | PRN
  Administered 2016-04-30: 2 mg via INTRAVENOUS

## 2016-04-30 MED ORDER — MIDAZOLAM HCL 2 MG/2ML IJ SOLN
INTRAMUSCULAR | Status: AC
  Filled 2016-04-30: qty 2

## 2016-04-30 MED ORDER — PROPOFOL 10 MG/ML IV BOLUS
INTRAVENOUS | Status: DC | PRN
  Administered 2016-04-30: 200 mg via INTRAVENOUS

## 2016-04-30 SURGICAL SUPPLY — 67 items
BANDAGE ACE 4X5 VEL STRL LF (GAUZE/BANDAGES/DRESSINGS) ×1 IMPLANT
BANDAGE ACE 6X5 VEL STRL LF (GAUZE/BANDAGES/DRESSINGS) ×1 IMPLANT
BANDAGE ESMARK 6X9 LF (GAUZE/BANDAGES/DRESSINGS) IMPLANT
BIT DRILL CANN 2.7X625 NONSTRL (BIT) ×1 IMPLANT
BIT DRILL LCP QC 2X140 (BIT) ×1 IMPLANT
BNDG CMPR 9X6 STRL LF SNTH (GAUZE/BANDAGES/DRESSINGS) ×1
BNDG COHESIVE 4X5 TAN STRL (GAUZE/BANDAGES/DRESSINGS) ×1 IMPLANT
BNDG COHESIVE 6X5 TAN STRL LF (GAUZE/BANDAGES/DRESSINGS) IMPLANT
BNDG ESMARK 6X9 LF (GAUZE/BANDAGES/DRESSINGS) ×2
CANISTER SUCT 3000ML PPV (MISCELLANEOUS) ×2 IMPLANT
COVER SURGICAL LIGHT HANDLE (MISCELLANEOUS) ×2 IMPLANT
CUFF TOURNIQUET SINGLE 34IN LL (TOURNIQUET CUFF) ×1 IMPLANT
CUFF TOURNIQUET SINGLE 44IN (TOURNIQUET CUFF) IMPLANT
DRAPE C-ARM 42X72 X-RAY (DRAPES) ×2 IMPLANT
DRAPE C-ARMOR (DRAPES) ×2 IMPLANT
DRAPE INCISE IOBAN 66X45 STRL (DRAPES) IMPLANT
DRAPE U-SHAPE 47X51 STRL (DRAPES) ×2 IMPLANT
DRSG ADAPTIC 3X8 NADH LF (GAUZE/BANDAGES/DRESSINGS) ×1 IMPLANT
DRSG PAD ABDOMINAL 8X10 ST (GAUZE/BANDAGES/DRESSINGS) ×1 IMPLANT
DURAPREP 26ML APPLICATOR (WOUND CARE) ×2 IMPLANT
ELECT CAUTERY BLADE 6.4 (BLADE) ×2 IMPLANT
ELECT REM PT RETURN 9FT ADLT (ELECTROSURGICAL) ×2
ELECTRODE REM PT RTRN 9FT ADLT (ELECTROSURGICAL) ×1 IMPLANT
FACESHIELD WRAPAROUND (MASK) IMPLANT
FACESHIELD WRAPAROUND OR TEAM (MASK) IMPLANT
GAUZE SPONGE 4X4 12PLY STRL (GAUZE/BANDAGES/DRESSINGS) ×2 IMPLANT
GAUZE XEROFORM 5X9 LF (GAUZE/BANDAGES/DRESSINGS) ×1 IMPLANT
GLOVE BIO SURGEON STRL SZ7.5 (GLOVE) ×2 IMPLANT
GLOVE BIOGEL PI IND STRL 8 (GLOVE) ×1 IMPLANT
GLOVE BIOGEL PI INDICATOR 8 (GLOVE) ×1
GOWN STRL REUS W/ TWL LRG LVL3 (GOWN DISPOSABLE) ×2 IMPLANT
GOWN STRL REUS W/ TWL XL LVL3 (GOWN DISPOSABLE) ×1 IMPLANT
GOWN STRL REUS W/TWL LRG LVL3 (GOWN DISPOSABLE) ×4
GOWN STRL REUS W/TWL XL LVL3 (GOWN DISPOSABLE) ×2
GUIDEWARE NON THREAD 1.25X150 (WIRE) ×4
GUIDEWIRE NON THREAD 1.25X150 (WIRE) ×2 IMPLANT
KIT BASIN OR (CUSTOM PROCEDURE TRAY) ×2 IMPLANT
KIT ROOM TURNOVER OR (KITS) ×2 IMPLANT
NDL HYPO 25GX1X1/2 BEV (NEEDLE) IMPLANT
NEEDLE HYPO 25GX1X1/2 BEV (NEEDLE) IMPLANT
NS IRRIG 1000ML POUR BTL (IV SOLUTION) ×2 IMPLANT
PACK ORTHO EXTREMITY (CUSTOM PROCEDURE TRAY) ×2 IMPLANT
PAD ARMBOARD 7.5X6 YLW CONV (MISCELLANEOUS) ×3 IMPLANT
PAD CAST 3X4 CTTN HI CHSV (CAST SUPPLIES) ×2 IMPLANT
PADDING CAST ABS 4INX4YD NS (CAST SUPPLIES) ×1
PADDING CAST ABS COTTON 4X4 ST (CAST SUPPLIES) ×1 IMPLANT
PADDING CAST COTTON 3X4 STRL (CAST SUPPLIES) ×4
PADDING CAST COTTON 6X4 STRL (CAST SUPPLIES) ×2 IMPLANT
PLATE 4HOLE DISTAL FIB R 2.7 (Plate) ×1 IMPLANT
SCREW CORT LP ST 3.5X14 (Screw) ×1 IMPLANT
SCREW LOCK VA ST 2.7X14 (Screw) ×1 IMPLANT
SCREW LOCK VA ST 2.7X18 (Screw) ×1 IMPLANT
SCREW LOCKING 2.7X16MM VA (Screw) ×3 IMPLANT
SCREW METAPHYSCAL 18MM (Screw) ×1 IMPLANT
SCREW SELF TAP 14MM (Screw) ×2 IMPLANT
SCREW SHORT THREAD 4.0X40 (Screw) ×4 IMPLANT
SPONGE GAUZE 4X4 12PLY STER LF (GAUZE/BANDAGES/DRESSINGS) ×1 IMPLANT
SPONGE LAP 18X18 X RAY DECT (DISPOSABLE) IMPLANT
STAPLER VISISTAT 35W (STAPLE) ×1 IMPLANT
SUCTION FRAZIER HANDLE 10FR (MISCELLANEOUS) ×1
SUCTION TUBE FRAZIER 10FR DISP (MISCELLANEOUS) ×1 IMPLANT
SUT MON AB 2-0 CT1 36 (SUTURE) ×2 IMPLANT
SYR CONTROL 10ML LL (SYRINGE) IMPLANT
TOWEL OR 17X24 6PK STRL BLUE (TOWEL DISPOSABLE) IMPLANT
TOWEL OR 17X26 10 PK STRL BLUE (TOWEL DISPOSABLE) ×4 IMPLANT
TUBE CONNECTING 12X1/4 (SUCTIONS) ×2 IMPLANT
WATER STERILE IRR 1000ML POUR (IV SOLUTION) ×1 IMPLANT

## 2016-04-30 NOTE — Anesthesia Procedure Notes (Addendum)
Anesthesia Regional Block:  Adductor canal block  Pre-Anesthetic Checklist: ,, timeout performed, Correct Patient, Correct Site, Correct Laterality, Correct Procedure, Correct Position, site marked, Risks and benefits discussed,  Surgical consent,  Pre-op evaluation,  At surgeon's request and post-op pain management  Laterality: Right  Prep: chloraprep       Needles:  Injection technique: Single-shot  Needle Type: Echogenic Stimulator Needle     Needle Length: 9cm 9 cm Needle Gauge: 21 and 21 G  Needle insertion depth: 5 cm   Additional Needles:  Procedures: ultrasound guided (picture in chart) Adductor canal block Narrative:  Start time: 04/30/2016 2:35 PM End time: 04/30/2016 2:40 PM Injection made incrementally with aspirations every 5 mL. Anesthesiologist: Lyn Hollingshead

## 2016-04-30 NOTE — Anesthesia Postprocedure Evaluation (Addendum)
Anesthesia Post Note  Patient: Tiffany Baird  Procedure(s) Performed: Procedure(s) (LRB): ORIF Right ankle (Right)  Patient location during evaluation: PACU Anesthesia Type: General Level of consciousness: sedated Pain management: pain level controlled Vital Signs Assessment: post-procedure vital signs reviewed and stable Respiratory status: spontaneous breathing and respiratory function stable Cardiovascular status: stable Anesthetic complications: no       Last Vitals:  Vitals:   04/30/16 1716 04/30/16 1742  BP: 139/73 (P) 134/66  Pulse: (!) 105 (!) (P) 105  Resp: 14 (P) 15  Temp:  (P) 36.4 C    Last Pain:  Vitals:   04/30/16 1244  TempSrc: Oral                 [name] DANIEL

## 2016-04-30 NOTE — Op Note (Signed)
   Date of Surgery: 04/30/2016  INDICATIONS: Tiffany Baird is a 71 y.o.-year-old female who sustained a right ankle fracture; she was indicated for open reduction and internal fixation due to the displaced nature of the articular fracture and came to the operating room today for this procedure. The patient did consent to the procedure after discussion of the risks and benefits.  PREOPERATIVE DIAGNOSIS: right trimalleolar ankle fracture  POSTOPERATIVE DIAGNOSIS: Same.  PROCEDURE: Open treatment of right ankle fracture with internal fixation.  Trimalleolar w/o fixation of posterior malleolus CPT 27822.   SURGEON: Jason P. Stann Mainland, M.D.  ASSIST: none.  ANESTHESIA:  general, with regional  TOURNIQUET TIME: 66 min.  IV FLUIDS AND URINE: See anesthesia.  ESTIMATED BLOOD LOSS: 10 mL.  IMPLANTS: Synthes distal fibula anatomic locking plate 4.0 mm cannulated screws x 2  COMPLICATIONS: None.  DESCRIPTION OF PROCEDURE: The patient was brought to the operating room and placed supine on the operating table.  The patient had been signed prior to the procedure and this was documented. The patient had the anesthesia placed by the anesthesiologist.  A nonsterile tourniquet was placed on the upper thigh.  The prep verification and incision time-outs were performed to confirm that this was the correct patient, site, side and location. The patient had an SCD on the opposite lower extremity. The patient did receive antibiotics prior to the incision and was re-dosed during the procedure as needed at indicated intervals.  The patient had the lower extremity prepped and draped in the standard surgical fashion.  The extremity was exsanguinated using an esmarch bandage and the tourniquet was inflated to 300 mm Hg.   Incision was made over the distal fibula and the fracture was exposed and reduced anatomically with a clamp. Due to poor bone quality I elected to compress through the plate and using some of the  locking options distally.  I then applied a distal fibular locking plate and secured it proximally and distally with non-locking screws proximally and locking screws distally. Bone quality was poor. I used c-arm to confirm satisfactory reduction and fixation.   I then turned my attention to the medial malleolus. Incision was made over the medial malleolus and the fracture exposed and held provisionally with a clamp. 2 guidepins were placed for the 4.0 mm cannulated screws and then confirmation of reduction was made with fluoroscopy. I then placed two  49mm screws which had satisfactory fixation.   The syndesmosis was stressed using live fluoroscopy and found to be stable.   The wounds were irrigated, and closed with vicryl with routine closure for the skin. Sterile gauze was applied followed by a posterior splint. She was awakened and returned to the PACU in stable and satisfactory condition. There were no complications.  Counts were correct.  POSTOPERATIVE PLAN: Tiffany Baird will remain nonweightbearing on this leg for approximately 6 weeks; Tiffany Baird will return for suture removal in 2 weeks.  He will be immobilized in a short leg splint and then transitioned to a CAM walker at his first follow up appointment.  Tiffany Baird will receive DVT prophylaxis based on other medications, activity level, and risk ratio of bleeding to thrombosis.  Geralynn Rile, [place] [phone] 9:36 PM

## 2016-04-30 NOTE — H&P (Signed)
ORTHOPAEDIC H and P  REQUESTING PHYSICIAN: Nicholes Stairs, MD  PCP:  Horatio Pel, MD  Chief Complaint: Right ankle fracture  HPI: Tiffany Baird is a 71 y.o. female who complains of  Right ankle fracture following a fall in [place] over last weekend.  She was splinted there and sent home for ORIF due to her cardiac hx.  I saw her in the office last week and we obtained cardiac clearance from her local cardiologist as well.  She presents today for surgery.  No new complaints and no distal neurovascular complaints.  She has been elevating over the weekend and since her return from [place].  Past Medical History:  Diagnosis Date  . Complication of anesthesia    pass out after awaken from anesthesia  . GERD (gastroesophageal reflux disease)   . H/O cardiovascular stress test 03/27/2011   EF 86%, no ischemia  . H/O echocardiogram 03/27/2011   EF >55%, mild MR, mild TR, RV systolic pressure 99991111,   . H/O: rheumatic fever    as a child  . Head injury due to trauma 2009   fall  . Headache    migraine history, last one in past few months  . HTN (hypertension)   . Hx of cardiac pacemaker 01/14/08   medtronic adapta  . Hx of syncope    X 18 years  . NSVT (nonsustained ventricular tachycardia) (HCC)    on pacer checks  . PSVT (paroxysmal supraventricular tachycardia) (HCC)    on pacer checks  . RBBB    PSVT,   . Sinus arrest 2009   6.7 sec pause with hx syncope   Past Surgical History:  Procedure Laterality Date  . CHOLECYSTECTOMY    . COLONOSCOPY W/ POLYPECTOMY  2012  . dental implants    . fractured leg    . NM MYOCAR PERF WALL MOTION  03/27/11   normal  . PACEMAKER INSERTION  01/14/2008   Medtronic   . US ECHOCARDIOGRAPHY  03/27/11   mild TR, no significant valvular disease.   Social History   Social History  . Marital status: Married    Spouse name: N/A  . Number of children: N/A  . Years of education: N/A   Social History Main Topics    . Smoking status: Never Smoker  . Smokeless tobacco: Never Used  . Alcohol use Yes     Comment: one glass of wine  a month  . Drug use: No  . Sexual activity: Not Asked   Other Topics Concern  . None   Social History Narrative  . None   Family History  Problem Relation Age of Onset  . Cancer Mother   . Diabetes Mother   . Heart disease Father   . Diabetes Brother    Allergies  Allergen Reactions  . Codeine Other (See Comments)    HALLUCINATION  . Hydrocodone Other (See Comments)    HALLUCINATIONS   . Sumycin [Tetracycline]     UNSPECIFIED REACTION   . Tegretol [Carbamazepine]     UNSPECIFIED REACTION    Prior to Admission medications   Medication Sig Start Date End Date Taking? Authorizing Provider  Ascorbic Acid (VITAMIN C PO) Take 1 tablet by mouth daily.   Yes Historical Provider, MD  calcium carbonate (OS-CAL) 600 MG TABS tablet Take 600 mg by mouth daily.   Yes Historical Provider, MD  cholecalciferol (VITAMIN D) 1000 UNITS tablet Take 1,000 Units by mouth daily.   Yes Historical Provider,  MD  Coenzyme Q10 (CO Q-10) 200 MG CAPS Take 200 mg by mouth daily.   Yes Historical Provider, MD  esomeprazole (NEXIUM) 20 MG capsule Take 20 mg by mouth 2 (two) times daily. May take an additional 20mg  for acid reflux   Yes Historical Provider, MD  Glucos-Chond-Hyal Ac-Ca Fructo ([place]) TABS Take 1 tablet by mouth 2 (two) times daily.   Yes Historical Provider, MD  metoprolol succinate (TOPROL-XL) 50 MG 24 hr tablet Take 0.5 mg by mouth 2 (two) times daily. Take half in the morning and half at night daily  03/13/15  Yes Historical Provider, MD  Multiple Vitamin (MULTIVITAMIN) tablet Take 1 tablet by mouth daily.   Yes Historical Provider, MD  Multiple Vitamins-Minerals (PRESERVISION AREDS) CAPS Take 1 capsule by mouth 2 (two) times daily.    Yes Historical Provider, MD   No results found.  Positive ROS: All other systems have been reviewed and were  otherwise negative with the exception of those mentioned in the HPI and as above.  Physical Exam: General: Alert, no acute distress Cardiovascular: No pedal edema Respiratory: No cyanosis, no use of accessory musculature GI: No organomegaly, abdomen is soft and non-tender Skin: No lesions in the area of chief complaint Neurologic: Sensation intact distally Psychiatric: Patient is competent for consent with normal mood and affect Lymphatic: No axillary or cervical lymphadenopathy  MUSCULOSKELETAL:  RLE- well padded splint in place, c/d/i.  Dorsal ecchymosis noted overlying the toes.  No pain with passive stretch.  +NVI  Assessment: Right ankle bimalleolar fracture  Plan: -to OR today for ORIF -The risks, benefits, and alternatives were discussed with the patient. There are risks associated with the surgery including, but not limited to, problems with anesthesia (death), infection, differences in leg length/angulation/rotation, fracture of bones, loosening or failure of implants, malunion, nonunion, hematoma (blood accumulation) which may require surgical drainage, blood clots, pulmonary embolism, nerve injury (foot drop), and blood vessel injury. The patient understands these risks and elects to proceed. -plan to admit to obs post op for pain management and cardiac monitoring -NWB RLE x 8 weeks    Nicholes Stairs, MD [facility] [phone]    04/30/2016 12:36 PM

## 2016-04-30 NOTE — Transfer of Care (Signed)
Immediate Anesthesia Transfer of Care Note  Patient: Tiffany Baird  Procedure(s) Performed: Procedure(s) with comments: ORIF Right ankle (Right) - Requests 70 mins  Patient Location: PACU  Anesthesia Type:GA combined with regional for post-op pain  Level of Consciousness: awake, alert , oriented and patient cooperative  Airway & Oxygen Therapy: Patient Spontanous Breathing and Patient connected to nasal cannula oxygen  Post-op Assessment: Report given to RN and Post -op Vital signs reviewed and stable  Post vital signs: Reviewed and stable  Last Vitals:  Vitals:   04/30/16 1244 04/30/16 1633  BP: (!) 95/59   Pulse: 93   Resp: 16   Temp: 36.8 C 36.4 C    Last Pain:  Vitals:   04/30/16 1244  TempSrc: Oral         Complications: No apparent anesthesia complications

## 2016-04-30 NOTE — Anesthesia Preprocedure Evaluation (Signed)
Anesthesia Evaluation  Patient identified by MRN, date of birth, ID band Patient awake    Reviewed: Allergy & Precautions, H&P , NPO status , Patient's Chart, lab work & pertinent test results, reviewed documented beta blocker date and time   Airway Mallampati: II  TM Distance: >3 FB Neck ROM: full    Dental no notable dental hx.    Pulmonary neg pulmonary ROS,    Pulmonary exam normal        Cardiovascular Exercise Tolerance: Good hypertension, Pt. on home beta blockers Normal cardiovascular exam+ pacemaker      Neuro/Psych negative psych ROS   GI/Hepatic Neg liver ROS, GERD  Medicated and Controlled,  Endo/Other  negative endocrine ROS  Renal/GU negative Renal ROS  negative genitourinary   Musculoskeletal   Abdominal (+) + obese,   Peds  Hematology negative hematology ROS (+)   Anesthesia Other Findings   Reproductive/Obstetrics negative OB ROS                             Anesthesia Physical Anesthesia Plan  ASA: II  Anesthesia Plan: General   Post-op Pain Management:    Induction:   Airway Management Planned:   Additional Equipment:   Intra-op Plan:   Post-operative Plan:   Informed Consent: I have reviewed the patients History and Physical, chart, labs and discussed the procedure including the risks, benefits and alternatives for the proposed anesthesia with the patient or authorized representative who has indicated his/her understanding and acceptance.     Plan Discussed with: CRNA  Anesthesia Plan Comments:         Anesthesia Quick Evaluation

## 2016-04-30 NOTE — Anesthesia Procedure Notes (Addendum)
Anesthesia Regional Block:  Popliteal block  Pre-Anesthetic Checklist: ,, timeout performed, Correct Patient, Correct Site, Correct Laterality, Correct Procedure, Correct Position, site marked, Risks and benefits discussed,  Surgical consent,  Pre-op evaluation,  At surgeon's request and post-op pain management  Laterality: Right  Prep: chloraprep       Needles:  Injection technique: Single-shot  Needle Type: Echogenic Stimulator Needle     Needle Length: 9cm 9 cm Needle Gauge: 21 and 21 G  Needle insertion depth: 5 cm   Additional Needles:  Procedures: ultrasound guided (picture in chart) Popliteal block Narrative:  Start time: 04/30/2016 2:43 PM End time: 04/30/2016 2:49 PM Injection made incrementally with aspirations every 5 mL. Anesthesiologist: Lyn Hollingshead

## 2016-04-30 NOTE — Anesthesia Procedure Notes (Signed)
Procedure Name: LMA Insertion Date/Time: 04/30/2016 3:02 PM Performed by: Gala Lewandowsky B Pre-anesthesia Checklist: Patient identified, Emergency Drugs available, Suction available and Patient being monitored Patient Re-evaluated:Patient Re-evaluated prior to inductionOxygen Delivery Method: Circle System Utilized Preoxygenation: Pre-oxygenation with 100% oxygen Intubation Type: IV induction Ventilation: Mask ventilation without difficulty LMA: LMA inserted LMA Size: 4.0 Number of attempts: 1 Placement Confirmation: positive ETCO2 Tube secured with: Tape Dental Injury: Teeth and Oropharynx as per pre-operative assessment

## 2016-04-30 NOTE — Brief Op Note (Signed)
04/30/2016  4:28 PM  PATIENT:  Tiffany Baird  71 y.o. female  PRE-OPERATIVE DIAGNOSIS:  Right bimalleolar ankle fracture  POST-OPERATIVE DIAGNOSIS:  Right bimalleolar ankle fracture  PROCEDURE:  Procedure(s) with comments: ORIF Right ankle (Right) - Requests 70 mins  SURGEON:  Surgeon(s) and Role:    * Nicholes Stairs, MD - Primary  PHYSICIAN ASSISTANT:   ASSISTANTS: none   ANESTHESIA:   regional and general  EBL:  No intake/output data recorded.  BLOOD ADMINISTERED:none  DRAINS: none   LOCAL MEDICATIONS USED:  NONE  SPECIMEN:  No Specimen  DISPOSITION OF SPECIMEN:  N/A  COUNTS:  YES  TOURNIQUET:   Total Tourniquet Time Documented: Thigh (Right) - 66 minutes Total: Thigh (Right) - 66 minutes   DICTATION: .Note written in EPIC  PLAN OF CARE: Admit for overnight observation  PATIENT DISPOSITION:  PACU - hemodynamically stable.   Delay start of Pharmacological VTE agent (>24hrs) due to surgical blood loss or risk of bleeding: not applicable

## 2016-05-01 DIAGNOSIS — S82851A Displaced trimalleolar fracture of right lower leg, initial encounter for closed fracture: Secondary | ICD-10-CM | POA: Diagnosis not present

## 2016-05-01 MED ORDER — OXYCODONE HCL 5 MG PO TABS: 5.0000 mg | ORAL_TABLET | ORAL | 0 refills | Status: DC | PRN

## 2016-05-01 MED ORDER — ONDANSETRON HCL 4 MG PO TABS: 4.0000 mg | ORAL_TABLET | Freq: Four times a day (QID) | ORAL | 0 refills | Status: DC | PRN

## 2016-05-01 NOTE — Evaluation (Signed)
Physical Therapy Evaluation Patient Details Name: Tiffany Baird MRN: [id number] DOB: 1945/07/13 Today's Date: 05/01/2016   History of Present Illness  Pt is a 71 y.o. female who fx her Rt ankle while on vacation in [place], [place]. Pt then returned home and has been awaiting surgery. Pt now s/p Rt ankle ORIF on 04/30/16. PMH: Lt ankle fx, pacemaker, PSVT, syncope, HTN.   Clinical Impression  Patient is s/p above surgery resulting in functional limitations due to the deficits listed below (see PT Problem List). Pt able to ambulate 7 ft with rw during initial PT session. Pt does have multiple stairs at home to reach her bed 12-19 and has been staying in her basement with 1 step to enter. Pt reports that she does not thing she will be able to get off her couch now like she was PTA to sleep. Recommending hospital bed and rw at D/C. Pt motivated to return home following acute stay but recommending HHPT services to follow. Patient will benefit from skilled PT to increase their independence and safety with mobility.      Follow Up Recommendations Home health PT;Supervision for mobility/OOB    Equipment Recommendations  Rolling walker with 5" wheels;Hospital bed    Recommendations for Other Services       Precautions / Restrictions Precautions Precautions: Fall Restrictions Weight Bearing Restrictions: Yes RLE Weight Bearing: Non weight bearing      Mobility  Bed Mobility Overal bed mobility: Needs Assistance Bed Mobility: Supine to Sit     Supine to sit: Supervision     General bed mobility comments: suping to sit with HOB approx. 20 degrees  Transfers Overall transfer level: Needs assistance Equipment used: Rolling walker (2 wheeled) Transfers: Sit to/from Stand Sit to Stand: Min guard         General transfer comment: guard for safety, cues for hand placement  Ambulation/Gait Ambulation/Gait assistance: Min guard Ambulation Distance (Feet): 7 Feet Assistive device:  Rolling walker (2 wheeled) Gait Pattern/deviations:  (swing-to pattern) Gait velocity: decreased   General Gait Details: Pt consistent with NWB status,  cues for gait sequence using rw.   Stairs            Wheelchair Mobility    Modified Rankin (Stroke Patients Only)       Balance Overall balance assessment: Needs assistance Sitting-balance support: No upper extremity supported Sitting balance-Leahy Scale: Good     Standing balance support: Bilateral upper extremity supported Standing balance-Leahy Scale: Poor Standing balance comment: using rw                             Pertinent Vitals/Pain Pain Assessment: No/denies pain    Home Living Family/patient expects to be discharged to:: Private residence Living Arrangements: Spouse/significant other Available Help at Discharge: Available 24 hours/day;Family Type of Home: House Home Access: Stairs to enter   [employer] of Steps: 1 from back entry, 9-12 from front. Home Layout: Multi-level (reports 12-19 steps between levels) Home Equipment: Crutches;Bedside commode Additional Comments: Pt was staying in her basement prior to surgery but sleeping on the couch which she states she would be unable to get out of at this time. Additionally she was using crutches but states that she felt unsafe/unsteady while using them.     Prior Function Level of Independence: Needs assistance   Gait / Transfers Assistance Needed: using rolling chair around the house, unable to go up/down stairs to access bedroom/kitchen. Using crutches  for short ambulation (2-3 steps).   ADL's / Homemaking Assistance Needed: Husband assisting with cooking tasks.         Hand Dominance        Extremity/Trunk Assessment   Upper Extremity Assessment Upper Extremity Assessment: Overall WFL for tasks assessed    Lower Extremity Assessment Lower Extremity Assessment: RLE deficits/detail RLE Deficits / Details: able to  perform SLR on Rt.        Communication   Communication: No difficulties  Cognition Arousal/Alertness: Awake/alert Behavior During Therapy: WFL for tasks assessed/performed Overall Cognitive Status: Within Functional Limits for tasks assessed                      General Comments      Exercises     Assessment/Plan    PT Assessment Patient needs continued PT services  PT Problem List Decreased strength;Decreased range of motion;Decreased activity tolerance;Decreased balance;Decreased mobility          PT Treatment Interventions DME instruction;Gait training;Stair training;Functional mobility training;Therapeutic activities;Therapeutic exercise;Patient/family education    PT Goals (Current goals can be found in the Care Plan section)  Acute Rehab PT Goals Patient Stated Goal: To get back home and walk again PT Goal Formulation: With patient Time For Goal Achievement: 05/15/16 Potential to Achieve Goals: Good    Frequency Min 3X/week   Barriers to discharge        Co-evaluation               End of Session Equipment Utilized During Treatment: Gait belt Activity Tolerance: Patient tolerated treatment well Patient left: in chair;with call bell/phone within reach Nurse Communication: Mobility status;Weight bearing status    Functional Assessment Tool Used: clinical judgment Functional Limitation: Mobility: Walking and moving around Mobility: Walking and Moving Around Current Status [phone]): At least 20 percent but less than 40 percent impaired, limited or restricted Mobility: Walking and Moving Around Goal Status [phone]): At least 1 percent but less than 20 percent impaired, limited or restricted    Time: 1101-1132 PT Time Calculation (min) (ACUTE ONLY): 31 min   Charges:   PT Evaluation $PT Eval Moderate Complexity: 1 Procedure PT Treatments $Gait Training: 8-22 mins   PT G Codes:   PT G-Codes **NOT FOR INPATIENT CLASS** Functional Assessment  Tool Used: clinical judgment Functional Limitation: Mobility: Walking and moving around Mobility: Walking and Moving Around Current Status [id number]): At least 20 percent but less than 40 percent impaired, limited or restricted Mobility: Walking and Moving Around Goal Status [phone]): At least 1 percent but less than 20 percent impaired, limited or restricted    Cassell Clement, PT, [place] Pager [phone] Office [phone]  05/01/2016, 11:50 AM

## 2016-05-01 NOTE — Progress Notes (Signed)
   Subjective:  Patient reports pain as mild.  No complaints  Objective:   VITALS:   Vitals:   04/30/16 1716 04/30/16 1742 04/30/16 2257 05/01/16 0634  BP: 139/73 134/66 (!) 106/58 (!) 95/55  Pulse: (!) 105 (!) 105 70 75  Resp: 14 15 18    Temp:  97.6 F (36.4 C) 98.3 F (36.8 C) 98.6 F (37 C)  TempSrc:   Oral Oral  SpO2: 96% 100% 92% 100%    Neurologically intact Neurovascular intact Sensation intact distally Intact pulses distally moderate dorsal edema   Lab Results  Component Value Date   WBC 7.6 04/30/2016   HGB 14.1 04/30/2016   HCT 42.8 04/30/2016   MCV 85.8 04/30/2016   PLT 326 04/30/2016   BMET    Component Value Date/Time   NA 141 04/30/2016 1232   K 3.7 04/30/2016 1232   CL 104 04/30/2016 1232   CO2 25 04/30/2016 1232   GLUCOSE 93 04/30/2016 1232   BUN 24 (H) 04/30/2016 1232   CREATININE 0.75 04/30/2016 1232   CREATININE 0.88 02/19/2013 1352   CALCIUM 9.8 04/30/2016 1232   GFRNONAA >60 04/30/2016 1232   GFRNONAA 68 02/19/2013 1352   GFRAA >60 04/30/2016 1232   GFRAA 79 02/19/2013 1352     Assessment/Plan: 1 Day Post-Op   Active Problems:   Bimalleolar ankle fracture, right, closed, initial encounter   Advance diet Up with therapy Will monitor progress with PT and pain today Plan for dc home today v tomorrow. NWB RLE lovenox for dvt ppx in house then dc on bid asa   Nicholes Stairs 05/01/2016, 7:51 AM   Geralynn Rile, MD [phone]

## 2016-05-01 NOTE — Discharge Instructions (Signed)
_keep right leg elevated -keep splint clean and dry -no weight bearing to the right leg -take 2 325 mg aspirin daily for 4 weeks to prevent blood clots.

## 2016-05-01 NOTE — Progress Notes (Signed)
Reviewed discharge instructions and medications with patient. Case manager arranged for hospital bed and home health PT. Answered patient's questions. Patient getting dressed to go home.

## 2016-05-01 NOTE — Progress Notes (Signed)
Physical Therapy Treatment Patient Details Name: Tiffany Baird MRN: [id number] DOB: 01-19-1946 Today's Date: 05/01/2016    History of Present Illness Pt is a 71 y.o. female who fx her Rt ankle while on vacation in [place], [place]. Pt then returned home and has been awaiting surgery. Pt now s/p Rt ankle ORIF on 04/30/16. PMH: Lt ankle fx, pacemaker, PSVT, syncope, HTN.     PT Comments    Pt progressing well with PT intervention, able to ambulate 75 ft with rw using swing-to pattern and up/down 1 step. Pt is consistent with NWB status throughout session. Following session, pt states that she feels confident with her mobility and would like to be able to go home. PT to continue to follow to progress mobility as tolerated, continuing to recommend HHPT services.   Follow Up Recommendations  Home health PT;Supervision for mobility/OOB     Equipment Recommendations  Rolling walker with 5" wheels;Hospital bed    Recommendations for Other Services       Precautions / Restrictions Precautions Precautions: Fall Restrictions Weight Bearing Restrictions: Yes RLE Weight Bearing: Non weight bearing    Mobility  Bed Mobility Overal bed mobility: Needs Assistance Bed Mobility: Sit to Supine     Supine to sit: Supervision Sit to supine: Supervision   General bed mobility comments: Pt able to raise LE into bed without assist.  Transfers Overall transfer level: Needs assistance Equipment used: Rolling walker (2 wheeled) Transfers: Sit to/from Stand Sit to Stand: Supervision         General transfer comment: supervision for safety  Ambulation/Gait Ambulation/Gait assistance: Min guard Ambulation Distance (Feet): 75 Feet Assistive device: Rolling walker (2 wheeled) Gait Pattern/deviations:  (swing-to pattern) Gait velocity: decreased   General Gait Details: Pt consistent with NWB status,  cues for gait sequence using rw.    Stairs Stairs: Yes   Stair Management: No  rails;Backwards;With walker;Step to pattern Number of Stairs: 1 General stair comments: Pt reports feeling confident with getting into her home.   Wheelchair Mobility    Modified Rankin (Stroke Patients Only)       Balance Overall balance assessment: Needs assistance Sitting-balance support: No upper extremity supported Sitting balance-Leahy Scale: Good     Standing balance support: Bilateral upper extremity supported Standing balance-Leahy Scale: Poor Standing balance comment: using rw                    Cognition Arousal/Alertness: Awake/alert Behavior During Therapy: WFL for tasks assessed/performed Overall Cognitive Status: Within Functional Limits for tasks assessed                      Exercises      General Comments        Pertinent Vitals/Pain Pain Assessment: No/denies pain    Home Living    Prior Function      PT Goals (current goals can now be found in the care plan section) Acute Rehab PT Goals Patient Stated Goal: get home PT Goal Formulation: With patient Time For Goal Achievement: 05/15/16 Potential to Achieve Goals: Good Progress towards PT goals: Progressing toward goals    Frequency    Min 3X/week      PT Plan Current plan remains appropriate    Co-evaluation             End of Session Equipment Utilized During Treatment: Gait belt Activity Tolerance: Patient tolerated treatment well Patient left: in bed;with call bell/phone within reach (Rt LE elevated with  ice)     Time: [id number] PT Time Calculation (min) (ACUTE ONLY): 18 min  Charges:  $Gait Training: 8-22 mins                    Cassell Clement, PT, CSCS Pager [phone] Office [phone]  05/01/2016, 2:55 PM

## 2016-05-02 NOTE — Care Management Note (Signed)
Case Management Note  Patient Details  Name: ARLINDA COTTOM MRN: [id number] Date of Birth: [birth year]  Subjective/Objective:    71 yr old female s/p ORIF of right ankle.                Action/Plan:  Referral for Home Health was called to [facility] care Liaison, Stevie Kern. Patient has requested a hospital bed, CM did order.    Expected Discharge Date:  05/01/16               Expected Discharge Plan:  [place]  In-House Referral:     Discharge planning Services  CM Consult  Post Acute Care Choice:  Durable Medical Equipment, Home Health Choice offered to:  Patient  DME Arranged:  Walker rolling, Hospital bed DME Agency:  [place]:  PT [place]:  [place]  Status of Service:  Completed, signed off  If discussed at [place] of Stay Meetings, dates discussed:    Additional Comments:  Ninfa Meeker, RN 05/02/2016, 8:51 AM

## 2016-05-03 ENCOUNTER — Encounter (HOSPITAL_COMMUNITY): Payer: Self-pay | Admitting: Orthopedic Surgery

## 2016-05-03 DIAGNOSIS — S82841D Displaced bimalleolar fracture of right lower leg, subsequent encounter for closed fracture with routine healing: Secondary | ICD-10-CM | POA: Diagnosis not present

## 2016-05-03 DIAGNOSIS — I4510 Unspecified right bundle-branch block: Secondary | ICD-10-CM | POA: Diagnosis not present

## 2016-05-03 DIAGNOSIS — I10 Essential (primary) hypertension: Secondary | ICD-10-CM | POA: Diagnosis not present

## 2016-05-03 DIAGNOSIS — I471 Supraventricular tachycardia: Secondary | ICD-10-CM | POA: Diagnosis not present

## 2016-05-03 DIAGNOSIS — Z950 Presence of cardiac pacemaker: Secondary | ICD-10-CM | POA: Diagnosis not present

## 2016-05-03 DIAGNOSIS — R296 Repeated falls: Secondary | ICD-10-CM | POA: Diagnosis not present

## 2016-05-03 DIAGNOSIS — K219 Gastro-esophageal reflux disease without esophagitis: Secondary | ICD-10-CM | POA: Diagnosis not present

## 2016-05-04 DIAGNOSIS — S82841A Displaced bimalleolar fracture of right lower leg, initial encounter for closed fracture: Secondary | ICD-10-CM | POA: Diagnosis not present
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
## 2016-05-08 DIAGNOSIS — I471 Supraventricular tachycardia: Secondary | ICD-10-CM | POA: Diagnosis not present

## 2016-05-08 DIAGNOSIS — S82841D Displaced bimalleolar fracture of right lower leg, subsequent encounter for closed fracture with routine healing: Secondary | ICD-10-CM | POA: Diagnosis not present

## 2016-05-08 DIAGNOSIS — K219 Gastro-esophageal reflux disease without esophagitis: Secondary | ICD-10-CM | POA: Diagnosis not present

## 2016-05-08 DIAGNOSIS — I10 Essential (primary) hypertension: Secondary | ICD-10-CM | POA: Diagnosis not present

## 2016-05-08 DIAGNOSIS — I4510 Unspecified right bundle-branch block: Secondary | ICD-10-CM | POA: Diagnosis not present

## 2016-05-08 DIAGNOSIS — Z950 Presence of cardiac pacemaker: Secondary | ICD-10-CM | POA: Diagnosis not present

## 2016-05-08 DIAGNOSIS — R296 Repeated falls: Secondary | ICD-10-CM | POA: Diagnosis not present

## 2016-05-14 DIAGNOSIS — I471 Supraventricular tachycardia: Secondary | ICD-10-CM | POA: Diagnosis not present

## 2016-05-14 DIAGNOSIS — K219 Gastro-esophageal reflux disease without esophagitis: Secondary | ICD-10-CM | POA: Diagnosis not present

## 2016-05-14 DIAGNOSIS — M8580 Other specified disorders of bone density and structure, unspecified site: Secondary | ICD-10-CM | POA: Diagnosis not present

## 2016-05-14 DIAGNOSIS — Z09 Encounter for follow-up examination after completed treatment for conditions other than malignant neoplasm: Secondary | ICD-10-CM | POA: Diagnosis not present

## 2016-05-16 ENCOUNTER — Ambulatory Visit (INDEPENDENT_AMBULATORY_CARE_PROVIDER_SITE_OTHER): Payer: Medicare HMO | Admitting: Cardiovascular Disease

## 2016-05-16 ENCOUNTER — Encounter: Payer: Self-pay | Admitting: Cardiovascular Disease

## 2016-05-16 VITALS — BP 130/78 | HR 86 | Ht 63.0 in | Wt 211.0 lb

## 2016-05-16 DIAGNOSIS — I472 Ventricular tachycardia: Secondary | ICD-10-CM | POA: Diagnosis not present

## 2016-05-16 DIAGNOSIS — R55 Syncope and collapse: Secondary | ICD-10-CM | POA: Diagnosis not present

## 2016-05-16 DIAGNOSIS — Z950 Presence of cardiac pacemaker: Secondary | ICD-10-CM

## 2016-05-16 DIAGNOSIS — I471 Supraventricular tachycardia: Secondary | ICD-10-CM

## 2016-05-16 DIAGNOSIS — I4729 Other ventricular tachycardia: Secondary | ICD-10-CM

## 2016-05-16 DIAGNOSIS — I10 Essential (primary) hypertension: Secondary | ICD-10-CM

## 2016-05-16 NOTE — Progress Notes (Signed)
Patient ID: Tiffany Baird, female   DOB: 10/05/1945, 71 y.o.   MRN: [id number]    Cardiology Office Note    Date:  05/16/2016   ID:  Tiffany Baird, DOB [birth year], MRN [id number]  PCP:  Horatio Pel, MD  Cardiologist:   Sanda Klein, MD   Chief Complaint  Patient presents with  . Follow-up    History of Present Illness:  Tiffany Baird is a 71 y.o. female with a history of sinus pause probably related to neurocardiogenic syncope, without recurrence of syncope after implantation of a dual-chamber permanent pacemaker in 2009. She does not have any known structural heart disease by previous noninvasive testing performed in 2012. She very infrequently requires pacing, confirming the absence of true sinus node dysfunction.  Her dual-chamber Medtronic Adapta pacemaker was implanted in 2009 and still has roughly 9 years of remaining battery longevity. There is virtually no atrial or ventricular pacing. Occasional episodes of high rates are recorded most of which are clearly supraventricular tachycardia, Rates around the 180 bpm. The longest episode was about 2 minutes in duration. They're not symptomatic. Nonsustained VT has not been recorded.  She feels great, denies any cardiovascular complaints. Unfortunately, during her recent vacation in [place] she fell and had a tibia fracture and ankle injury. She has had surgery and is still non-weight bearing, wearing a surgical boot for the next 4 weeks.    Past Medical History:  Diagnosis Date  . Complication of anesthesia    pass out after awaken from anesthesia  . GERD (gastroesophageal reflux disease)   . H/O cardiovascular stress test 03/27/2011   EF 86%, no ischemia  . H/O echocardiogram 03/27/2011   EF >55%, mild MR, mild TR, RV systolic pressure [id number],   . H/O: rheumatic fever    as a child  . Head injury due to trauma 2009   fall  . Headache    migraine history, last one in past few months  . HTN (hypertension)   .  Hx of cardiac pacemaker 01/14/08   medtronic adapta  . Hx of syncope    X 18 years  . NSVT (nonsustained ventricular tachycardia) (HCC)    on pacer checks  . PSVT (paroxysmal supraventricular tachycardia) (HCC)    on pacer checks  . RBBB    PSVT,   . Sinus arrest 2009   6.7 sec pause with hx syncope    Past Surgical History:  Procedure Laterality Date  . CHOLECYSTECTOMY    . COLONOSCOPY W/ POLYPECTOMY  2012  . dental implants    . fractured leg    . NM MYOCAR PERF WALL MOTION  03/27/11   normal  . ORIF ANKLE FRACTURE Right 04/30/2016   Procedure: ORIF Right ankle;  Surgeon: Nicholes Stairs, MD;  Location: [facility];  Service: Orthopedics;  Laterality: Right;  Requests 70 mins  . PACEMAKER INSERTION  01/14/2008   Medtronic   . US ECHOCARDIOGRAPHY  03/27/11   mild TR, no significant valvular disease.    Current Outpatient Prescriptions  Medication Sig Dispense Refill  . Ascorbic Acid (VITAMIN C PO) Take 1 tablet by mouth daily.    . calcium carbonate (OS-CAL) 600 MG TABS tablet Take 600 mg by mouth daily.    . cholecalciferol (VITAMIN D) 1000 UNITS tablet Take 1,000 Units by mouth daily.    . Coenzyme Q10 (CO Q-10) 200 MG CAPS Take 200 mg by mouth daily.    Marland Kitchen esomeprazole (NEXIUM) 20 MG capsule Take 20  mg by mouth 2 (two) times daily. May take an additional 20mg  for acid reflux    . Glucos-Chond-Hyal Ac-Ca Fructo (MOVE FREE JOINT HEALTH ADVANCE) TABS Take 1 tablet by mouth 2 (two) times daily.    . metoprolol succinate (TOPROL-XL) 50 MG 24 hr tablet Take 0.5 mg by mouth 2 (two) times daily. Take half in the morning and half at night daily     . Multiple Vitamin (MULTIVITAMIN) tablet Take 1 tablet by mouth daily.    . Multiple Vitamins-Minerals (PRESERVISION AREDS) CAPS Take 1 capsule by mouth 2 (two) times daily.      No current facility-administered medications for this visit.     Allergies:   Codeine; Hydrocodone; Ash; Sumycin [tetracycline]; and Tegretol [carbamazepine]    Social History   Social History  . Marital status: Married    Spouse name: N/A  . Number of children: N/A  . Years of education: N/A   Social History Main Topics  . Smoking status: Never Smoker  . Smokeless tobacco: Never Used  . Alcohol use Yes     Comment: one glass of wine  a month  . Drug use: No  . Sexual activity: Not Asked   Other Topics Concern  . None   Social History Narrative  . None     Family History:  The patient's family history includes Cancer in her mother; Diabetes in her brother and mother; Heart disease in her father.   ROS:   Please see the history of present illness.    Review of Systems  All other systems reviewed and are negative.    PHYSICAL EXAM:   VS:  BP 130/78   Pulse 86   Ht 5\' 3"  (1.6 m)   Wt 95.7 kg (211 lb)   BMI 37.38 kg/m    GEN: Well nourished, well developed, in no acute distress  HEENT: normal  Neck: no JVD, carotid bruits, or masses Cardiac: RRR; no murmurs, rubs, or gallops,no edema ; also left subclavian pacemaker site Respiratory:  clear to auscultation bilaterally, normal work of breathing GI: soft, nontender, nondistended, + BS MS: no deformity or atrophy ; wearing a surgical boot on her right lower extremity Skin: warm and dry, no rash Neuro:  Alert and Oriented x 3, Strength and sensation are intact Psych: euthymic mood, full affect  Wt Readings from Last 3 Encounters:  05/16/16 95.7 kg (211 lb)  04/05/15 95.7 kg (211 lb)  03/30/14 93.7 kg (206 lb 8 oz)      Studies/Labs Reviewed:   EKG:  EKG is not ordered today.    ASSESSMENT:    1. Neurocardiogenic syncope   2. PSVT (paroxysmal supraventricular tachycardia) ([place])   3. NSVT (nonsustained ventricular tachycardia) ([place])   4. Hx of cardiac pacemaker   5. Essential hypertension      PLAN:  In order of problems listed above:  1. Vasovagal syncope: No recurrence of syncope since pacemaker implantation 2. PAT: In the absence of structural heart  disease her brief episodes of asymptomatic tachyarrhythmia are not likely to be clinically important. He is taking a beta blocker. 3. NSVT: Previously recorded on her pacemaker, but none seen since the last download 4. PM: Normally functioning dual-chamber permanent pacemaker, CareLink remote downloads every 3 months and yearly office visit. 5. HTN: Well-controlled blood pressure on a low dose of beta blocker   Medication Adjustments/Labs and Tests Ordered: Current medicines are reviewed at length with the patient today.  Concerns regarding medicines are outlined  above.  Medication changes, Labs and Tests ordered today are listed below. Patient Instructions  Dr Sallyanne Kuster recommends that you continue on your current medications as directed. Please refer to the Current Medication list given to you today.  Remote monitoring is used to monitor your Pacemaker of ICD from home. This monitoring reduces the number of office visits required to check your device to one time per year. It allows [place] to keep an eye on the functioning of your device to ensure it is working properly. You are scheduled for a device check from home on Wednesday, May 2nd, 2018. You may send your transmission at any time that day. If you have a wireless device, the transmission will be sent automatically. After your physician reviews your transmission, you will receive a postcard with your next transmission date.  Dr Sallyanne Kuster recommends that you schedule a follow-up appointment in 12 months with a pacemaker check. You will receive a reminder letter in the mail two months in advance. If you don't receive a letter, please call our office to schedule the follow-up appointment.  If you need a refill on your cardiac medications before your next appointment, please call your pharmacy.     Signed, Sanda Klein, MD  05/16/2016 10:54 AM    [facility] [place], [place], [place]  [address] Phone: [phone];  Fax: [phone]

## 2016-05-16 NOTE — Patient Instructions (Signed)
Dr Sallyanne Kuster recommends that you continue on your current medications as directed. Please refer to the Current Medication list given to you today.  Remote monitoring is used to monitor your Pacemaker of ICD from home. This monitoring reduces the number of office visits required to check your device to one time per year. It allows [place] to keep an eye on the functioning of your device to ensure it is working properly. You are scheduled for a device check from home on Wednesday, May 2nd, 2018. You may send your transmission at any time that day. If you have a wireless device, the transmission will be sent automatically. After your physician reviews your transmission, you will receive a postcard with your next transmission date.  Dr Sallyanne Kuster recommends that you schedule a follow-up appointment in 12 months with a pacemaker check. You will receive a reminder letter in the mail two months in advance. If you don't receive a letter, please call our office to schedule the follow-up appointment.  If you need a refill on your cardiac medications before your next appointment, please call your pharmacy.

## 2016-05-28 DIAGNOSIS — S82851D Displaced trimalleolar fracture of right lower leg, subsequent encounter for closed fracture with routine healing: Secondary | ICD-10-CM | POA: Diagnosis not present

## 2016-05-28 LAB — CUP PACEART INCLINIC DEVICE CHECK
Date Time Interrogation Session: 20180212092916
Implantable Lead Implant Date: 20090930
Implantable Lead Implant Date: 20090930
Implantable Lead Location: 753859
Implantable Lead Model: 5076
Implantable Lead Model: 5076
Lead Channel Setting Pacing Amplitude: 2 V
Lead Channel Setting Pacing Amplitude: 2 V
Lead Channel Setting Pacing Pulse Width: 0.4 ms
Lead Channel Setting Sensing Sensitivity: 2.8 mV
MDC IDC LEAD LOCATION: 753860
MDC IDC PG IMPLANT DT: 20090930

## 2016-05-31 DIAGNOSIS — S82851D Displaced trimalleolar fracture of right lower leg, subsequent encounter for closed fracture with routine healing: Secondary | ICD-10-CM | POA: Diagnosis not present

## 2016-06-04 DIAGNOSIS — S82841A Displaced bimalleolar fracture of right lower leg, initial encounter for closed fracture: Secondary | ICD-10-CM | POA: Diagnosis not present

## 2016-06-05 DIAGNOSIS — S82851D Displaced trimalleolar fracture of right lower leg, subsequent encounter for closed fracture with routine healing: Secondary | ICD-10-CM | POA: Diagnosis not present

## 2016-06-07 DIAGNOSIS — S82851D Displaced trimalleolar fracture of right lower leg, subsequent encounter for closed fracture with routine healing: Secondary | ICD-10-CM | POA: Diagnosis not present

## 2016-06-12 DIAGNOSIS — S82851D Displaced trimalleolar fracture of right lower leg, subsequent encounter for closed fracture with routine healing: Secondary | ICD-10-CM | POA: Diagnosis not present

## 2016-06-12 DIAGNOSIS — S82841D Displaced bimalleolar fracture of right lower leg, subsequent encounter for closed fracture with routine healing: Secondary | ICD-10-CM | POA: Diagnosis not present

## 2016-06-14 DIAGNOSIS — S82851D Displaced trimalleolar fracture of right lower leg, subsequent encounter for closed fracture with routine healing: Secondary | ICD-10-CM | POA: Diagnosis not present

## 2016-06-19 DIAGNOSIS — S82851D Displaced trimalleolar fracture of right lower leg, subsequent encounter for closed fracture with routine healing: Secondary | ICD-10-CM | POA: Diagnosis not present

## 2016-06-21 DIAGNOSIS — S82851D Displaced trimalleolar fracture of right lower leg, subsequent encounter for closed fracture with routine healing: Secondary | ICD-10-CM | POA: Diagnosis not present

## 2016-06-26 DIAGNOSIS — S82851D Displaced trimalleolar fracture of right lower leg, subsequent encounter for closed fracture with routine healing: Secondary | ICD-10-CM | POA: Diagnosis not present

## 2016-06-28 DIAGNOSIS — S82851D Displaced trimalleolar fracture of right lower leg, subsequent encounter for closed fracture with routine healing: Secondary | ICD-10-CM | POA: Diagnosis not present

## 2016-07-03 DIAGNOSIS — S82851D Displaced trimalleolar fracture of right lower leg, subsequent encounter for closed fracture with routine healing: Secondary | ICD-10-CM | POA: Diagnosis not present

## 2016-07-05 DIAGNOSIS — S82851D Displaced trimalleolar fracture of right lower leg, subsequent encounter for closed fracture with routine healing: Secondary | ICD-10-CM | POA: Diagnosis not present

## 2016-08-02 DIAGNOSIS — Z86010 Personal history of colonic polyps: Secondary | ICD-10-CM | POA: Diagnosis not present

## 2016-08-15 ENCOUNTER — Ambulatory Visit (INDEPENDENT_AMBULATORY_CARE_PROVIDER_SITE_OTHER): Payer: Medicare HMO | Admitting: *Deleted

## 2016-08-15 DIAGNOSIS — I495 Sick sinus syndrome: Secondary | ICD-10-CM | POA: Diagnosis not present

## 2016-08-15 DIAGNOSIS — Z86010 Personal history of colonic polyps: Secondary | ICD-10-CM | POA: Diagnosis not present

## 2016-08-15 LAB — CUP PACEART REMOTE DEVICE CHECK
Battery Voltage: 2.79 V
Brady Statistic AP VP Percent: 0 %
Brady Statistic AP VS Percent: 0 %
Brady Statistic AS VP Percent: 0 %
Implantable Lead Implant Date: 20090930
Implantable Lead Location: 753860
Implantable Lead Model: 5076
Implantable Pulse Generator Implant Date: 20090930
Lead Channel Impedance Value: 489 Ohm
Lead Channel Pacing Threshold Amplitude: 0.5 V
Lead Channel Pacing Threshold Amplitude: 0.875 V
Lead Channel Pacing Threshold Pulse Width: 0.4 ms
Lead Channel Pacing Threshold Pulse Width: 0.4 ms
Lead Channel Setting Pacing Amplitude: 1.875
Lead Channel Setting Pacing Pulse Width: 0.4 ms
Lead Channel Setting Sensing Sensitivity: 2.8 mV
MDC IDC LEAD IMPLANT DT: 20090930
MDC IDC LEAD LOCATION: 753859
MDC IDC MSMT BATTERY IMPEDANCE: 606 Ohm
MDC IDC MSMT BATTERY REMAINING LONGEVITY: 102 mo
MDC IDC MSMT LEADCHNL RV IMPEDANCE VALUE: 481 Ohm
MDC IDC SESS DTM: 20180502111654
MDC IDC SET LEADCHNL RV PACING AMPLITUDE: 2 V
MDC IDC STAT BRADY AS VS PERCENT: 100 %

## 2016-08-15 NOTE — Progress Notes (Signed)
Remote pacemaker transmission.   

## 2016-08-16 ENCOUNTER — Encounter: Payer: Self-pay | Admitting: Cardiology

## 2016-08-21 DIAGNOSIS — Z86010 Personal history of colonic polyps: Secondary | ICD-10-CM | POA: Diagnosis not present

## 2016-08-28 DIAGNOSIS — R69 Illness, unspecified: Secondary | ICD-10-CM | POA: Diagnosis not present

## 2016-08-30 ENCOUNTER — Encounter: Payer: Self-pay | Admitting: Cardiology

## 2016-09-04 DIAGNOSIS — R69 Illness, unspecified: Secondary | ICD-10-CM | POA: Diagnosis not present

## 2016-09-15 NOTE — Addendum Note (Signed)
Addendum  created 09/15/16 0812 by Ying Blankenhorn, MD   Sign clinical note    

## 2016-10-31 ENCOUNTER — Other Ambulatory Visit: Payer: Self-pay | Admitting: Family Medicine

## 2016-10-31 DIAGNOSIS — Z1231 Encounter for screening mammogram for malignant neoplasm of breast: Secondary | ICD-10-CM

## 2016-11-14 ENCOUNTER — Ambulatory Visit (INDEPENDENT_AMBULATORY_CARE_PROVIDER_SITE_OTHER): Payer: Medicare HMO | Admitting: *Deleted

## 2016-11-14 ENCOUNTER — Telehealth: Payer: Self-pay | Admitting: Cardiology

## 2016-11-14 DIAGNOSIS — N390 Urinary tract infection, site not specified: Secondary | ICD-10-CM | POA: Diagnosis not present

## 2016-11-14 DIAGNOSIS — I495 Sick sinus syndrome: Secondary | ICD-10-CM | POA: Diagnosis not present

## 2016-11-14 DIAGNOSIS — I10 Essential (primary) hypertension: Secondary | ICD-10-CM | POA: Diagnosis not present

## 2016-11-14 DIAGNOSIS — M899 Disorder of bone, unspecified: Secondary | ICD-10-CM | POA: Diagnosis not present

## 2016-11-14 DIAGNOSIS — Z0000 Encounter for general adult medical examination without abnormal findings: Secondary | ICD-10-CM | POA: Diagnosis not present

## 2016-11-14 DIAGNOSIS — K219 Gastro-esophageal reflux disease without esophagitis: Secondary | ICD-10-CM | POA: Diagnosis not present

## 2016-11-14 NOTE — Telephone Encounter (Signed)
LMOVM reminding pt to send remote transmission.   

## 2016-11-15 ENCOUNTER — Encounter: Payer: Self-pay | Admitting: Cardiology

## 2016-11-15 NOTE — Progress Notes (Signed)
Remote pacemaker transmission.   

## 2016-11-16 LAB — CUP PACEART REMOTE DEVICE CHECK
Battery Impedance: 655 Ohm
Battery Remaining Longevity: 98 mo
Battery Voltage: 2.79 V
Brady Statistic AP VP Percent: 0 %
Brady Statistic AP VS Percent: 0 %
Date Time Interrogation Session: 20180801193445
Implantable Lead Implant Date: 20090930
Implantable Lead Model: 5076
Implantable Pulse Generator Implant Date: 20090930
Lead Channel Impedance Value: 476 Ohm
Lead Channel Pacing Threshold Amplitude: 1.125 V
Lead Channel Pacing Threshold Pulse Width: 0.4 ms
Lead Channel Sensing Intrinsic Amplitude: 2 mV
Lead Channel Setting Pacing Amplitude: 2.25 V
Lead Channel Setting Pacing Pulse Width: 0.4 ms
MDC IDC LEAD IMPLANT DT: 20090930
MDC IDC LEAD LOCATION: 753859
MDC IDC LEAD LOCATION: 753860
MDC IDC MSMT LEADCHNL RA PACING THRESHOLD PULSEWIDTH: 0.4 ms
MDC IDC MSMT LEADCHNL RV IMPEDANCE VALUE: 535 Ohm
MDC IDC MSMT LEADCHNL RV PACING THRESHOLD AMPLITUDE: 0.75 V
MDC IDC MSMT LEADCHNL RV SENSING INTR AMPL: 5.6 mV
MDC IDC SET LEADCHNL RV PACING AMPLITUDE: 2 V
MDC IDC SET LEADCHNL RV SENSING SENSITIVITY: 2 mV
MDC IDC STAT BRADY AS VP PERCENT: 0 %
MDC IDC STAT BRADY AS VS PERCENT: 100 %

## 2016-11-19 DIAGNOSIS — I495 Sick sinus syndrome: Secondary | ICD-10-CM | POA: Diagnosis not present

## 2016-11-19 DIAGNOSIS — K449 Diaphragmatic hernia without obstruction or gangrene: Secondary | ICD-10-CM | POA: Diagnosis not present

## 2016-11-19 DIAGNOSIS — Z0001 Encounter for general adult medical examination with abnormal findings: Secondary | ICD-10-CM | POA: Diagnosis not present

## 2016-11-19 DIAGNOSIS — Z950 Presence of cardiac pacemaker: Secondary | ICD-10-CM | POA: Diagnosis not present

## 2016-11-28 ENCOUNTER — Ambulatory Visit
Admission: RE | Admit: 2016-11-28 | Discharge: 2016-11-28 | Disposition: A | Discharge status: Home / Self Care | Payer: Medicare HMO | Source: Ambulatory Visit | Attending: Family Medicine | Admitting: Family Medicine

## 2016-11-28 DIAGNOSIS — Z1231 Encounter for screening mammogram for malignant neoplasm of breast: Secondary | ICD-10-CM

## 2016-12-10 ENCOUNTER — Encounter: Payer: Self-pay | Admitting: Cardiology

## 2016-12-11 DIAGNOSIS — D225 Melanocytic nevi of trunk: Secondary | ICD-10-CM | POA: Diagnosis not present

## 2016-12-11 DIAGNOSIS — L821 Other seborrheic keratosis: Secondary | ICD-10-CM | POA: Diagnosis not present

## 2016-12-11 DIAGNOSIS — D1801 Hemangioma of skin and subcutaneous tissue: Secondary | ICD-10-CM | POA: Diagnosis not present

## 2016-12-11 DIAGNOSIS — L720 Epidermal cyst: Secondary | ICD-10-CM | POA: Diagnosis not present

## 2016-12-11 DIAGNOSIS — L820 Inflamed seborrheic keratosis: Secondary | ICD-10-CM | POA: Diagnosis not present

## 2016-12-28 DIAGNOSIS — M5134 Other intervertebral disc degeneration, thoracic region: Secondary | ICD-10-CM | POA: Diagnosis not present

## 2016-12-28 DIAGNOSIS — M5136 Other intervertebral disc degeneration, lumbar region: Secondary | ICD-10-CM | POA: Diagnosis not present

## 2016-12-28 DIAGNOSIS — M546 Pain in thoracic spine: Secondary | ICD-10-CM | POA: Diagnosis not present

## 2016-12-28 DIAGNOSIS — M545 Low back pain: Secondary | ICD-10-CM | POA: Diagnosis not present

## 2017-01-15 DIAGNOSIS — R69 Illness, unspecified: Secondary | ICD-10-CM | POA: Diagnosis not present

## 2017-01-27 DIAGNOSIS — R69 Illness, unspecified: Secondary | ICD-10-CM | POA: Diagnosis not present

## 2017-02-13 ENCOUNTER — Ambulatory Visit (INDEPENDENT_AMBULATORY_CARE_PROVIDER_SITE_OTHER): Payer: Medicare HMO | Admitting: *Deleted

## 2017-02-13 DIAGNOSIS — I495 Sick sinus syndrome: Secondary | ICD-10-CM | POA: Diagnosis not present

## 2017-02-15 ENCOUNTER — Encounter: Payer: Self-pay | Admitting: Cardiology

## 2017-02-15 NOTE — Progress Notes (Signed)
Remote pacemaker transmission.   

## 2017-02-18 LAB — CUP PACEART REMOTE DEVICE CHECK
Battery Impedance: 704 Ohm
Battery Remaining Longevity: 95 mo
Battery Voltage: 2.79 V
Brady Statistic AP VP Percent: 0 %
Implantable Lead Implant Date: 20090930
Implantable Lead Location: 753860
Implantable Lead Model: 5076
Implantable Lead Model: 5076
Implantable Pulse Generator Implant Date: 20090930
Lead Channel Impedance Value: 556 Ohm
Lead Channel Pacing Threshold Amplitude: 0.875 V
Lead Channel Pacing Threshold Pulse Width: 0.4 ms
Lead Channel Setting Pacing Amplitude: 1.75 V
Lead Channel Setting Pacing Pulse Width: 0.4 ms
Lead Channel Setting Sensing Sensitivity: 2.8 mV
MDC IDC LEAD IMPLANT DT: 20090930
MDC IDC LEAD LOCATION: 753859
MDC IDC MSMT LEADCHNL RA IMPEDANCE VALUE: 503 Ohm
MDC IDC MSMT LEADCHNL RA SENSING INTR AMPL: 1.4 mV
MDC IDC MSMT LEADCHNL RV PACING THRESHOLD AMPLITUDE: 0.75 V
MDC IDC MSMT LEADCHNL RV PACING THRESHOLD PULSEWIDTH: 0.4 ms
MDC IDC MSMT LEADCHNL RV SENSING INTR AMPL: 5.6 mV
MDC IDC SESS DTM: 20181031131800
MDC IDC SET LEADCHNL RV PACING AMPLITUDE: 2 V
MDC IDC STAT BRADY AP VS PERCENT: 0 %
MDC IDC STAT BRADY AS VP PERCENT: 0 %
MDC IDC STAT BRADY AS VS PERCENT: 100 %

## 2017-04-03 DIAGNOSIS — R062 Wheezing: Secondary | ICD-10-CM | POA: Diagnosis not present

## 2017-04-03 DIAGNOSIS — J4521 Mild intermittent asthma with (acute) exacerbation: Secondary | ICD-10-CM | POA: Diagnosis not present

## 2017-05-07 DIAGNOSIS — R69 Illness, unspecified: Secondary | ICD-10-CM | POA: Diagnosis not present

## 2017-05-08 ENCOUNTER — Encounter: Payer: Self-pay | Admitting: Cardiovascular Disease

## 2017-05-08 ENCOUNTER — Ambulatory Visit: Payer: Medicare HMO | Admitting: Cardiovascular Disease

## 2017-05-08 VITALS — BP 133/82 | HR 67 | Ht 65.0 in | Wt 209.0 lb

## 2017-05-08 DIAGNOSIS — R55 Syncope and collapse: Secondary | ICD-10-CM | POA: Diagnosis not present

## 2017-05-08 DIAGNOSIS — I471 Supraventricular tachycardia: Secondary | ICD-10-CM

## 2017-05-08 DIAGNOSIS — Z950 Presence of cardiac pacemaker: Secondary | ICD-10-CM | POA: Diagnosis not present

## 2017-05-08 DIAGNOSIS — I10 Essential (primary) hypertension: Secondary | ICD-10-CM | POA: Diagnosis not present

## 2017-05-08 DIAGNOSIS — I472 Ventricular tachycardia: Secondary | ICD-10-CM

## 2017-05-08 NOTE — Progress Notes (Signed)
Patient ID: Tiffany Baird, female   DOB: 01-03-1946, 72 y.o.   MRN: [id number]    Cardiology Office Note    Date:  05/08/2017   ID:  Tiffany Baird, DOB 1945-12-04, MRN [id number]  PCP:  Deland Pretty, MD  Cardiologist:   Sanda Klein, MD   Chief Complaint  Patient presents with  . Follow-up    History of Present Illness:  Tiffany Baird is a 72 y.o. female with a history of sinus pause probably related to neurocardiogenic syncope, without recurrence of syncope after implantation of a dual-chamber permanent pacemaker in 2009. She does not have any known structural heart disease by previous noninvasive testing performed in 2012. She very infrequently requires pacing, confirming the absence of true sinus node dysfunction.  She had a serious fall during a vacation in [place] (again), but this was not due to loss of consciousness.  She recalls seeing herself tumble down the stairs.  Thankfully she did not have any major injuries.  Her dual-chamber Medtronic Adapta pacemaker was implanted in 2009 and still has roughly 8 years of remaining battery longevity.  She has less than 0.1% pacing in both the atrium and ventricle.  She does have occasional episodes of sudden bradycardia response. A few episodes of high ventricular rates are clearly atrial-driven; some may be sinus tachycardia, but others are clearly too fast for this and represent ectopic atrial tachycardia.  As before these episodes are brief, infrequent and appear to be asymptomatic.  The longest one since her last device check was only 45 seconds in duration.  There is no true ventricular tachycardia seen.  The patient specifically denies any chest pain at rest or with exertion, dyspnea at rest or with exertion, orthopnea, paroxysmal nocturnal dyspnea, syncope, palpitations, focal neurological deficits, intermittent claudication, lower extremity edema, unexplained weight gain, cough, hemoptysis or wheezing. ks.    Past Medical  History:  Diagnosis Date  . Complication of anesthesia    pass out after awaken from anesthesia  . GERD (gastroesophageal reflux disease)   . H/O cardiovascular stress test 03/27/2011   EF 86%, no ischemia  . H/O echocardiogram 03/27/2011   EF >55%, mild MR, mild TR, RV systolic pressure [id number],   . H/O: rheumatic fever    as a child  . Head injury due to trauma 2009   fall  . Headache    migraine history, last one in past few months  . HTN (hypertension)   . Hx of cardiac pacemaker 01/14/08   medtronic adapta  . Hx of syncope    X 18 years  . NSVT (nonsustained ventricular tachycardia) (HCC)    on pacer checks  . PSVT (paroxysmal supraventricular tachycardia) (HCC)    on pacer checks  . RBBB    PSVT,   . Sinus arrest 2009   6.7 sec pause with hx syncope    Past Surgical History:  Procedure Laterality Date  . CHOLECYSTECTOMY    . COLONOSCOPY W/ POLYPECTOMY  2012  . dental implants    . fractured leg    . NM MYOCAR PERF WALL MOTION  03/27/11   normal  . ORIF ANKLE FRACTURE Right 04/30/2016   Procedure: ORIF Right ankle;  Surgeon: Nicholes Stairs, MD;  Location: [facility];  Service: Orthopedics;  Laterality: Right;  Requests 70 mins  . PACEMAKER INSERTION  01/14/2008   Medtronic   . US ECHOCARDIOGRAPHY  03/27/11   mild TR, no significant valvular disease.    Current Outpatient Medications  Medication Sig Dispense Refill  . Ascorbic Acid (VITAMIN C PO) Take 1 tablet by mouth daily.    . calcium carbonate (OS-CAL) 600 MG TABS tablet Take 600 mg by mouth daily.    . cholecalciferol (VITAMIN D) 1000 UNITS tablet Take 1,000 Units by mouth daily.    . Coenzyme Q10 (CO Q-10) 200 MG CAPS Take 200 mg by mouth daily.    Marland Kitchen esomeprazole (NEXIUM) 20 MG capsule Take 20 mg by mouth 2 (two) times daily. May take an additional 20mg  for acid reflux    . Glucos-Chond-Hyal Ac-Ca Fructo (MOVE FREE JOINT HEALTH ADVANCE) TABS Take 1 tablet by mouth 2 (two) times daily.    . metoprolol  succinate (TOPROL-XL) 50 MG 24 hr tablet Take 0.5 mg by mouth 2 (two) times daily. Take half in the morning and half at night daily     . Multiple Vitamin (MULTIVITAMIN) tablet Take 1 tablet by mouth daily.    . Multiple Vitamins-Minerals (PRESERVISION AREDS) CAPS Take 1 capsule by mouth 2 (two) times daily.      No current facility-administered medications for this visit.     Allergies:   Codeine; Hydrocodone; Ash; Sumycin [tetracycline]; and Tegretol [carbamazepine]   Social History   Socioeconomic History  . Marital status: Married    Spouse name: None  . Number of children: None  . Years of education: None  . Highest education level: None  Social Needs  . Financial resource strain: None  . Food insecurity - worry: None  . Food insecurity - inability: None  . Transportation needs - medical: None  . Transportation needs - non-medical: None  Occupational History  . None  Tobacco Use  . Smoking status: Never Smoker  . Smokeless tobacco: Never Used  Substance and Sexual Activity  . Alcohol use: Yes    Comment: one glass of wine  a month  . Drug use: No  . Sexual activity: None  Other Topics Concern  . None  Social History Narrative  . None     Family History:  The patient's family history includes Breast cancer in her mother; Cancer in her mother; Diabetes in her brother and mother; Heart disease in her father.   ROS:   Please see the history of present illness.    Review of Systems  All other systems reviewed and are negative.    PHYSICAL EXAM:   VS:  BP 133/82   Pulse 67   Ht 5\' 5"  (1.651 m)   Wt 209 lb (94.8 kg)   BMI 34.78 kg/m     General: Alert, oriented x3, no distress, moderately obese Head: no evidence of trauma, PERRL, EOMI, no exophtalmos or lid lag, no myxedema, no xanthelasma; normal ears, nose and oropharynx Neck: normal jugular venous pulsations and no hepatojugular reflux; brisk carotid pulses without delay and no carotid bruits Chest: clear  to auscultation, no signs of consolidation by percussion or palpation, normal fremitus, symmetrical and full respiratory excursions Cardiovascular: normal position and quality of the apical impulse, regular rhythm, normal first and second heart sounds, no murmurs, rubs or gallops.  Healthy pacemaker site Abdomen: no tenderness or distention, no masses by palpation, no abnormal pulsatility or arterial bruits, normal bowel sounds, no hepatosplenomegaly Extremities: no clubbing, cyanosis or edema; 2+ radial, ulnar and brachial pulses bilaterally; 2+ right femoral, posterior tibial and dorsalis pedis pulses; 2+ left femoral, posterior tibial and dorsalis pedis pulses; no subclavian or femoral bruits Neurological: grossly nonfocal Psych: Normal mood and affect  affect  Wt Readings from Last 3 Encounters:  05/08/17 209 lb (94.8 kg)  05/16/16 211 lb (95.7 kg)  04/05/15 211 lb (95.7 kg)      Studies/Labs Reviewed:   EKG:  EKG is ordered today.  Shows normal sinus rhythm, right bundle branch block, QTC 460 ms   ASSESSMENT:    1. Vasovagal syncope   2. PAT (paroxysmal atrial tachycardia) ([place])   3. Hx of cardiac pacemaker   4. Essential hypertension      PLAN:  In order of problems listed above:  1. Vasovagal syncope: She has not had syncope or presyncope since her pacemaker implantation.  Her recent falls were not associated with loss of consciousness 2. PAT: These episodes are brief and asymptomatic.  She is on a beta-blocker.  3. PM: Normally functioning dual-chamber permanent pacemaker, CareLink remote downloads every 3 months and yearly office visit. 4. HTN: Well-controlled.   Medication Adjustments/Labs and Tests Ordered: Current medicines are reviewed at length with the patient today.  Concerns regarding medicines are outlined above.  Medication changes, Labs and Tests ordered today are listed below. There are no Patient Instructions on file for this visit.    Signed, Sanda Klein, MD  05/08/2017 11:15 AM    [facility] [place] [place], [place], [place]  [address] Phone: [phone]; Fax: [phone]

## 2017-05-08 NOTE — Patient Instructions (Signed)

## 2017-05-15 ENCOUNTER — Ambulatory Visit (INDEPENDENT_AMBULATORY_CARE_PROVIDER_SITE_OTHER): Payer: Medicare HMO | Admitting: *Deleted

## 2017-05-15 DIAGNOSIS — I495 Sick sinus syndrome: Secondary | ICD-10-CM | POA: Diagnosis not present

## 2017-05-15 NOTE — Progress Notes (Signed)
Remote pacemaker transmission.   

## 2017-05-16 ENCOUNTER — Encounter: Payer: Self-pay | Admitting: Cardiology

## 2017-05-31 LAB — CUP PACEART REMOTE DEVICE CHECK
Battery Impedance: 781 Ohm
Battery Voltage: 2.79 V
Brady Statistic AP VP Percent: 0 %
Brady Statistic AP VS Percent: 0 %
Brady Statistic AS VP Percent: 0 %
Implantable Lead Implant Date: 20090930
Implantable Lead Location: 753859
Implantable Lead Model: 5076
Implantable Lead Model: 5076
Lead Channel Impedance Value: 512 Ohm
Lead Channel Pacing Threshold Amplitude: 1 V
Lead Channel Pacing Threshold Pulse Width: 0.4 ms
Lead Channel Setting Pacing Amplitude: 2 V
Lead Channel Setting Pacing Pulse Width: 0.4 ms
MDC IDC LEAD IMPLANT DT: 20090930
MDC IDC LEAD LOCATION: 753860
MDC IDC MSMT BATTERY REMAINING LONGEVITY: 90 mo
MDC IDC MSMT LEADCHNL RV IMPEDANCE VALUE: 455 Ohm
MDC IDC MSMT LEADCHNL RV PACING THRESHOLD AMPLITUDE: 0.625 V
MDC IDC MSMT LEADCHNL RV PACING THRESHOLD PULSEWIDTH: 0.4 ms
MDC IDC PG IMPLANT DT: 20090930
MDC IDC SESS DTM: 20190130154929
MDC IDC SET LEADCHNL RA PACING AMPLITUDE: 2 V
MDC IDC SET LEADCHNL RV SENSING SENSITIVITY: 2.8 mV
MDC IDC STAT BRADY AS VS PERCENT: 100 %

## 2017-06-05 LAB — CUP PACEART INCLINIC DEVICE CHECK
Date Time Interrogation Session: 20190220170148
Implantable Lead Implant Date: 20090930
Implantable Lead Location: 753859
Implantable Lead Location: 753860
Implantable Lead Model: 5076
Implantable Lead Model: 5076
Lead Channel Setting Pacing Amplitude: 1.75 V
Lead Channel Setting Pacing Amplitude: 2 V
Lead Channel Setting Pacing Pulse Width: 0.4 ms
MDC IDC LEAD IMPLANT DT: 20090930
MDC IDC PG IMPLANT DT: 20090930
MDC IDC SET LEADCHNL RV SENSING SENSITIVITY: 2.8 mV

## 2017-08-07 DIAGNOSIS — R69 Illness, unspecified: Secondary | ICD-10-CM | POA: Diagnosis not present

## 2017-08-14 ENCOUNTER — Telehealth: Payer: Self-pay | Admitting: Cardiology

## 2017-08-14 ENCOUNTER — Encounter: Payer: Medicare HMO | Admitting: *Deleted

## 2017-08-14 NOTE — Telephone Encounter (Signed)
LMOVM reminding pt to send remote transmission.   

## 2017-08-15 ENCOUNTER — Encounter: Payer: Self-pay | Admitting: Cardiology

## 2017-08-29 ENCOUNTER — Ambulatory Visit (INDEPENDENT_AMBULATORY_CARE_PROVIDER_SITE_OTHER): Payer: Medicare HMO | Admitting: *Deleted

## 2017-08-29 DIAGNOSIS — I495 Sick sinus syndrome: Secondary | ICD-10-CM

## 2017-08-30 ENCOUNTER — Encounter: Payer: Self-pay | Admitting: Cardiology

## 2017-08-30 NOTE — Progress Notes (Signed)
Remote pacemaker transmission.   

## 2017-08-30 NOTE — Progress Notes (Signed)
Letter  

## 2017-09-03 LAB — CUP PACEART REMOTE DEVICE CHECK
Battery Impedance: 854 Ohm
Battery Voltage: 2.79 V
Brady Statistic AP VP Percent: 0 %
Brady Statistic AP VS Percent: 0 %
Brady Statistic AS VS Percent: 100 %
Implantable Lead Implant Date: 20090930
Implantable Lead Implant Date: 20090930
Implantable Lead Location: 753859
Implantable Lead Model: 5076
Implantable Pulse Generator Implant Date: 20090930
Lead Channel Impedance Value: 483 Ohm
Lead Channel Impedance Value: 505 Ohm
Lead Channel Pacing Threshold Amplitude: 1 V
Lead Channel Pacing Threshold Pulse Width: 0.4 ms
Lead Channel Pacing Threshold Pulse Width: 0.4 ms
Lead Channel Sensing Intrinsic Amplitude: 1.4 mV
Lead Channel Sensing Intrinsic Amplitude: 5.6 mV
Lead Channel Setting Pacing Amplitude: 2 V
MDC IDC LEAD LOCATION: 753860
MDC IDC MSMT BATTERY REMAINING LONGEVITY: 86 mo
MDC IDC MSMT LEADCHNL RV PACING THRESHOLD AMPLITUDE: 0.625 V
MDC IDC SESS DTM: 20190516145856
MDC IDC SET LEADCHNL RV PACING AMPLITUDE: 2 V
MDC IDC SET LEADCHNL RV PACING PULSEWIDTH: 0.4 ms
MDC IDC SET LEADCHNL RV SENSING SENSITIVITY: 2 mV
MDC IDC STAT BRADY AS VP PERCENT: 0 %

## 2017-11-06 DIAGNOSIS — R69 Illness, unspecified: Secondary | ICD-10-CM | POA: Diagnosis not present

## 2017-11-12 ENCOUNTER — Other Ambulatory Visit: Payer: Self-pay | Admitting: Obstetrics & Gynecology

## 2017-11-12 DIAGNOSIS — Z1231 Encounter for screening mammogram for malignant neoplasm of breast: Secondary | ICD-10-CM

## 2017-11-20 ENCOUNTER — Encounter: Payer: Self-pay | Admitting: Cardiovascular Disease

## 2017-11-20 DIAGNOSIS — M8580 Other specified disorders of bone density and structure, unspecified site: Secondary | ICD-10-CM | POA: Diagnosis not present

## 2017-11-20 DIAGNOSIS — K219 Gastro-esophageal reflux disease without esophagitis: Secondary | ICD-10-CM | POA: Diagnosis not present

## 2017-11-20 DIAGNOSIS — I495 Sick sinus syndrome: Secondary | ICD-10-CM | POA: Diagnosis not present

## 2017-11-20 DIAGNOSIS — I10 Essential (primary) hypertension: Secondary | ICD-10-CM | POA: Diagnosis not present

## 2017-11-20 DIAGNOSIS — Z0001 Encounter for general adult medical examination with abnormal findings: Secondary | ICD-10-CM | POA: Diagnosis not present

## 2017-11-25 DIAGNOSIS — B001 Herpesviral vesicular dermatitis: Secondary | ICD-10-CM | POA: Diagnosis not present

## 2017-11-25 DIAGNOSIS — M8580 Other specified disorders of bone density and structure, unspecified site: Secondary | ICD-10-CM | POA: Diagnosis not present

## 2017-11-25 DIAGNOSIS — K219 Gastro-esophageal reflux disease without esophagitis: Secondary | ICD-10-CM | POA: Diagnosis not present

## 2017-11-25 DIAGNOSIS — Z950 Presence of cardiac pacemaker: Secondary | ICD-10-CM | POA: Diagnosis not present

## 2017-11-25 DIAGNOSIS — K449 Diaphragmatic hernia without obstruction or gangrene: Secondary | ICD-10-CM | POA: Diagnosis not present

## 2017-11-25 DIAGNOSIS — I4510 Unspecified right bundle-branch block: Secondary | ICD-10-CM | POA: Diagnosis not present

## 2017-11-25 DIAGNOSIS — I471 Supraventricular tachycardia: Secondary | ICD-10-CM | POA: Diagnosis not present

## 2017-11-25 DIAGNOSIS — I10 Essential (primary) hypertension: Secondary | ICD-10-CM | POA: Diagnosis not present

## 2017-11-25 DIAGNOSIS — Z0000 Encounter for general adult medical examination without abnormal findings: Secondary | ICD-10-CM | POA: Diagnosis not present

## 2017-11-25 DIAGNOSIS — I495 Sick sinus syndrome: Secondary | ICD-10-CM | POA: Diagnosis not present

## 2017-11-25 DIAGNOSIS — M859 Disorder of bone density and structure, unspecified: Secondary | ICD-10-CM | POA: Diagnosis not present

## 2017-11-27 DIAGNOSIS — R69 Illness, unspecified: Secondary | ICD-10-CM | POA: Diagnosis not present

## 2017-11-28 ENCOUNTER — Ambulatory Visit (INDEPENDENT_AMBULATORY_CARE_PROVIDER_SITE_OTHER): Payer: Medicare HMO | Admitting: *Deleted

## 2017-11-28 DIAGNOSIS — I495 Sick sinus syndrome: Secondary | ICD-10-CM

## 2017-11-28 DIAGNOSIS — I4729 Other ventricular tachycardia: Secondary | ICD-10-CM

## 2017-11-28 DIAGNOSIS — I472 Ventricular tachycardia: Secondary | ICD-10-CM

## 2017-11-28 NOTE — Progress Notes (Signed)
Remote pacemaker transmission.   

## 2017-12-10 ENCOUNTER — Ambulatory Visit
Admission: RE | Admit: 2017-12-10 | Discharge: 2017-12-10 | Disposition: A | Discharge status: Home / Self Care | Payer: Medicare HMO | Source: Ambulatory Visit | Attending: Obstetrics & Gynecology | Admitting: Obstetrics & Gynecology

## 2017-12-10 DIAGNOSIS — Z1231 Encounter for screening mammogram for malignant neoplasm of breast: Secondary | ICD-10-CM

## 2017-12-11 DIAGNOSIS — Z01419 Encounter for gynecological examination (general) (routine) without abnormal findings: Secondary | ICD-10-CM | POA: Diagnosis not present

## 2017-12-30 DIAGNOSIS — R69 Illness, unspecified: Secondary | ICD-10-CM | POA: Diagnosis not present

## 2018-01-06 LAB — CUP PACEART REMOTE DEVICE CHECK
Brady Statistic AP VP Percent: 0 %
Brady Statistic AP VS Percent: 0 %
Brady Statistic AS VS Percent: 100 %
Date Time Interrogation Session: 20190815124950
Implantable Lead Implant Date: 20090930
Implantable Lead Location: 753859
Implantable Lead Location: 753860
Implantable Pulse Generator Implant Date: 20090930
Lead Channel Impedance Value: 514 Ohm
Lead Channel Pacing Threshold Amplitude: 0.75 V
Lead Channel Pacing Threshold Pulse Width: 0.4 ms
Lead Channel Pacing Threshold Pulse Width: 0.4 ms
Lead Channel Setting Pacing Amplitude: 2 V
Lead Channel Setting Sensing Sensitivity: 2 mV
MDC IDC LEAD IMPLANT DT: 20090930
MDC IDC MSMT BATTERY IMPEDANCE: 856 Ohm
MDC IDC MSMT BATTERY REMAINING LONGEVITY: 86 mo
MDC IDC MSMT BATTERY VOLTAGE: 2.79 V
MDC IDC MSMT LEADCHNL RA IMPEDANCE VALUE: 496 Ohm
MDC IDC MSMT LEADCHNL RA PACING THRESHOLD AMPLITUDE: 1 V
MDC IDC SET LEADCHNL RA PACING AMPLITUDE: 2 V
MDC IDC SET LEADCHNL RV PACING PULSEWIDTH: 0.4 ms
MDC IDC STAT BRADY AS VP PERCENT: 0 %

## 2018-02-19 DIAGNOSIS — R69 Illness, unspecified: Secondary | ICD-10-CM | POA: Diagnosis not present

## 2018-02-27 ENCOUNTER — Ambulatory Visit (INDEPENDENT_AMBULATORY_CARE_PROVIDER_SITE_OTHER): Payer: Medicare HMO | Admitting: *Deleted

## 2018-02-27 ENCOUNTER — Telehealth: Payer: Self-pay | Admitting: Cardiology

## 2018-02-27 DIAGNOSIS — I495 Sick sinus syndrome: Secondary | ICD-10-CM

## 2018-02-27 NOTE — Telephone Encounter (Signed)
Spoke with pt and reminded pt of remote transmission that is due today. Pt verbalized understanding.   

## 2018-03-03 NOTE — Progress Notes (Signed)
Remote pacemaker transmission.   

## 2018-04-28 LAB — CUP PACEART REMOTE DEVICE CHECK
Battery Impedance: 982 Ohm
Brady Statistic AP VP Percent: 0 %
Brady Statistic AP VS Percent: 0 %
Brady Statistic AS VP Percent: 0 %
Brady Statistic AS VS Percent: 100 %
Date Time Interrogation Session: 20191115200802
Implantable Lead Implant Date: 20090930
Implantable Lead Location: 753859
Implantable Lead Model: 5076
Implantable Lead Model: 5076
Lead Channel Impedance Value: 460 Ohm
Lead Channel Impedance Value: 497 Ohm
Lead Channel Pacing Threshold Amplitude: 0.75 V
Lead Channel Pacing Threshold Pulse Width: 0.4 ms
Lead Channel Setting Pacing Amplitude: 2 V
MDC IDC LEAD IMPLANT DT: 20090930
MDC IDC LEAD LOCATION: 753860
MDC IDC MSMT BATTERY REMAINING LONGEVITY: 79 mo
MDC IDC MSMT BATTERY VOLTAGE: 2.78 V
MDC IDC MSMT LEADCHNL RA PACING THRESHOLD AMPLITUDE: 1 V
MDC IDC MSMT LEADCHNL RV PACING THRESHOLD PULSEWIDTH: 0.4 ms
MDC IDC PG IMPLANT DT: 20090930
MDC IDC SET LEADCHNL RA PACING AMPLITUDE: 2 V
MDC IDC SET LEADCHNL RV PACING PULSEWIDTH: 0.4 ms
MDC IDC SET LEADCHNL RV SENSING SENSITIVITY: 2 mV

## 2018-05-07 DIAGNOSIS — H5203 Hypermetropia, bilateral: Secondary | ICD-10-CM | POA: Diagnosis not present

## 2018-05-07 DIAGNOSIS — H2513 Age-related nuclear cataract, bilateral: Secondary | ICD-10-CM | POA: Diagnosis not present

## 2018-05-07 DIAGNOSIS — H353132 Nonexudative age-related macular degeneration, bilateral, intermediate dry stage: Secondary | ICD-10-CM | POA: Diagnosis not present

## 2018-05-07 DIAGNOSIS — H524 Presbyopia: Secondary | ICD-10-CM | POA: Diagnosis not present

## 2018-05-07 DIAGNOSIS — H52203 Unspecified astigmatism, bilateral: Secondary | ICD-10-CM | POA: Diagnosis not present

## 2018-05-20 DIAGNOSIS — L570 Actinic keratosis: Secondary | ICD-10-CM | POA: Diagnosis not present

## 2018-05-20 DIAGNOSIS — L821 Other seborrheic keratosis: Secondary | ICD-10-CM | POA: Diagnosis not present

## 2018-05-20 DIAGNOSIS — B078 Other viral warts: Secondary | ICD-10-CM | POA: Diagnosis not present

## 2018-05-20 DIAGNOSIS — D225 Melanocytic nevi of trunk: Secondary | ICD-10-CM | POA: Diagnosis not present

## 2018-05-29 ENCOUNTER — Ambulatory Visit (INDEPENDENT_AMBULATORY_CARE_PROVIDER_SITE_OTHER): Payer: Medicare HMO

## 2018-05-29 DIAGNOSIS — I495 Sick sinus syndrome: Secondary | ICD-10-CM

## 2018-05-30 LAB — CUP PACEART REMOTE DEVICE CHECK
Brady Statistic AP VP Percent: 0 %
Brady Statistic AP VS Percent: 0 %
Brady Statistic AS VP Percent: 0 %
Brady Statistic AS VS Percent: 100 %
Date Time Interrogation Session: 20200212145548
Implantable Lead Implant Date: 20090930
Implantable Lead Implant Date: 20090930
Implantable Lead Location: 753860
Implantable Lead Model: 5076
Lead Channel Impedance Value: 440 Ohm
Lead Channel Impedance Value: 483 Ohm
Lead Channel Pacing Threshold Amplitude: 0.75 V
Lead Channel Pacing Threshold Amplitude: 1 V
Lead Channel Pacing Threshold Pulse Width: 0.4 ms
Lead Channel Pacing Threshold Pulse Width: 0.4 ms
Lead Channel Setting Sensing Sensitivity: 2 mV
MDC IDC LEAD LOCATION: 753859
MDC IDC MSMT BATTERY IMPEDANCE: 980 Ohm
MDC IDC MSMT BATTERY REMAINING LONGEVITY: 79 mo
MDC IDC MSMT BATTERY VOLTAGE: 2.78 V
MDC IDC PG IMPLANT DT: 20090930
MDC IDC SET LEADCHNL RA PACING AMPLITUDE: 2 V
MDC IDC SET LEADCHNL RV PACING AMPLITUDE: 2 V
MDC IDC SET LEADCHNL RV PACING PULSEWIDTH: 0.4 ms

## 2018-06-10 NOTE — Progress Notes (Signed)
Remote pacemaker transmission.   

## 2018-09-02 ENCOUNTER — Ambulatory Visit (INDEPENDENT_AMBULATORY_CARE_PROVIDER_SITE_OTHER): Payer: Medicare HMO | Admitting: *Deleted

## 2018-09-02 ENCOUNTER — Other Ambulatory Visit: Payer: Self-pay

## 2018-09-02 DIAGNOSIS — I495 Sick sinus syndrome: Secondary | ICD-10-CM

## 2018-09-03 LAB — CUP PACEART REMOTE DEVICE CHECK
Battery Impedance: 1137 Ohm
Battery Remaining Longevity: 72 mo
Battery Voltage: 2.79 V
Brady Statistic AP VP Percent: 0 %
Brady Statistic AP VS Percent: 0 %
Brady Statistic AS VP Percent: 0 %
Brady Statistic AS VS Percent: 100 %
Date Time Interrogation Session: 20200519161551
Implantable Lead Implant Date: 20090930
Implantable Lead Implant Date: 20090930
Implantable Lead Location: 753859
Implantable Lead Location: 753860
Implantable Lead Model: 5076
Implantable Lead Model: 5076
Implantable Pulse Generator Implant Date: 20090930
Lead Channel Impedance Value: 490 Ohm
Lead Channel Impedance Value: 518 Ohm
Lead Channel Pacing Threshold Amplitude: 0.875 V
Lead Channel Pacing Threshold Amplitude: 1 V
Lead Channel Pacing Threshold Pulse Width: 0.4 ms
Lead Channel Pacing Threshold Pulse Width: 0.4 ms
Lead Channel Setting Pacing Amplitude: 2 V
Lead Channel Setting Pacing Amplitude: 2 V
Lead Channel Setting Pacing Pulse Width: 0.4 ms
Lead Channel Setting Sensing Sensitivity: 2 mV

## 2018-09-11 ENCOUNTER — Encounter: Payer: Self-pay | Admitting: Cardiology

## 2018-09-11 NOTE — Progress Notes (Signed)
Remote pacemaker transmission.   

## 2018-10-21 DIAGNOSIS — R69 Illness, unspecified: Secondary | ICD-10-CM | POA: Diagnosis not present

## 2018-11-06 DIAGNOSIS — R69 Illness, unspecified: Secondary | ICD-10-CM | POA: Diagnosis not present

## 2018-11-27 DIAGNOSIS — K219 Gastro-esophageal reflux disease without esophagitis: Secondary | ICD-10-CM | POA: Diagnosis not present

## 2018-11-27 DIAGNOSIS — E7800 Pure hypercholesterolemia, unspecified: Secondary | ICD-10-CM | POA: Diagnosis not present

## 2018-11-27 DIAGNOSIS — I10 Essential (primary) hypertension: Secondary | ICD-10-CM | POA: Diagnosis not present

## 2018-11-27 DIAGNOSIS — Z1159 Encounter for screening for other viral diseases: Secondary | ICD-10-CM | POA: Diagnosis not present

## 2018-12-01 DIAGNOSIS — Z0000 Encounter for general adult medical examination without abnormal findings: Secondary | ICD-10-CM | POA: Diagnosis not present

## 2018-12-01 DIAGNOSIS — I471 Supraventricular tachycardia: Secondary | ICD-10-CM | POA: Diagnosis not present

## 2018-12-01 DIAGNOSIS — B001 Herpesviral vesicular dermatitis: Secondary | ICD-10-CM | POA: Diagnosis not present

## 2018-12-01 DIAGNOSIS — K219 Gastro-esophageal reflux disease without esophagitis: Secondary | ICD-10-CM | POA: Diagnosis not present

## 2018-12-01 DIAGNOSIS — I4510 Unspecified right bundle-branch block: Secondary | ICD-10-CM | POA: Diagnosis not present

## 2018-12-01 DIAGNOSIS — I10 Essential (primary) hypertension: Secondary | ICD-10-CM | POA: Diagnosis not present

## 2018-12-01 DIAGNOSIS — I495 Sick sinus syndrome: Secondary | ICD-10-CM | POA: Diagnosis not present

## 2018-12-01 DIAGNOSIS — Z950 Presence of cardiac pacemaker: Secondary | ICD-10-CM | POA: Diagnosis not present

## 2018-12-01 DIAGNOSIS — M8580 Other specified disorders of bone density and structure, unspecified site: Secondary | ICD-10-CM | POA: Diagnosis not present

## 2018-12-01 DIAGNOSIS — G43009 Migraine without aura, not intractable, without status migrainosus: Secondary | ICD-10-CM | POA: Diagnosis not present

## 2018-12-02 ENCOUNTER — Ambulatory Visit (INDEPENDENT_AMBULATORY_CARE_PROVIDER_SITE_OTHER): Payer: Medicare HMO | Admitting: *Deleted

## 2018-12-02 DIAGNOSIS — I472 Ventricular tachycardia: Secondary | ICD-10-CM | POA: Diagnosis not present

## 2018-12-02 DIAGNOSIS — R55 Syncope and collapse: Secondary | ICD-10-CM

## 2018-12-02 DIAGNOSIS — I4729 Other ventricular tachycardia: Secondary | ICD-10-CM

## 2018-12-02 LAB — CUP PACEART REMOTE DEVICE CHECK
Battery Impedance: 1137 Ohm
Battery Remaining Longevity: 72 mo
Battery Voltage: 2.78 V
Brady Statistic AP VP Percent: 0 %
Brady Statistic AP VS Percent: 0 %
Brady Statistic AS VP Percent: 0 %
Brady Statistic AS VS Percent: 100 %
Date Time Interrogation Session: 20200818151811
Implantable Lead Implant Date: 20090930
Implantable Lead Implant Date: 20090930
Implantable Lead Location: 753859
Implantable Lead Location: 753860
Implantable Lead Model: 5076
Implantable Lead Model: 5076
Implantable Pulse Generator Implant Date: 20090930
Lead Channel Impedance Value: 489 Ohm
Lead Channel Impedance Value: 517 Ohm
Lead Channel Pacing Threshold Amplitude: 0.875 V
Lead Channel Pacing Threshold Amplitude: 1 V
Lead Channel Pacing Threshold Pulse Width: 0.4 ms
Lead Channel Pacing Threshold Pulse Width: 0.4 ms
Lead Channel Setting Pacing Amplitude: 2 V
Lead Channel Setting Pacing Amplitude: 2 V
Lead Channel Setting Pacing Pulse Width: 0.4 ms
Lead Channel Setting Sensing Sensitivity: 2 mV

## 2018-12-10 NOTE — Progress Notes (Signed)
Remote pacemaker transmission.   

## 2019-01-12 IMAGING — RF DG C-ARM 61-120 MIN
1 series · 7 of 7 positions shown · non-contrast
Comparison: None.

CLINICAL DATA: Trimalleolar fracture of the ankle.

EXAM:
DG C-ARM 61-120 MIN; RIGHT ANKLE - COMPLETE 3+ VIEW

[Series 1: run · 7 of 7 slices shown]
[im 1/7]
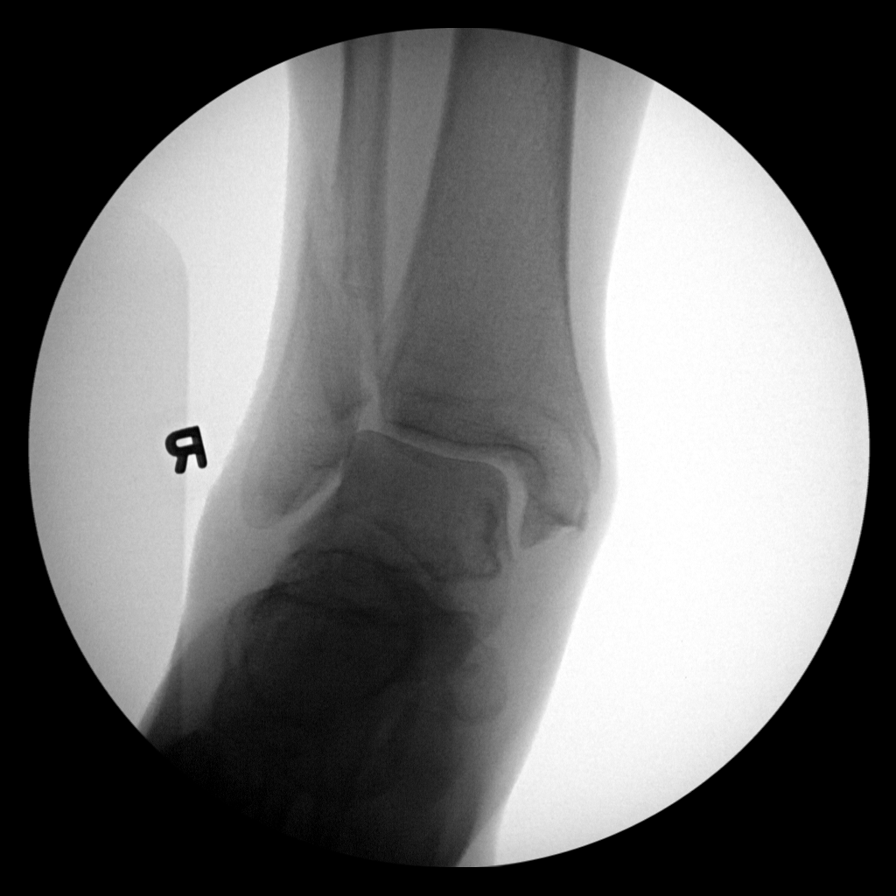
[im 2/7]
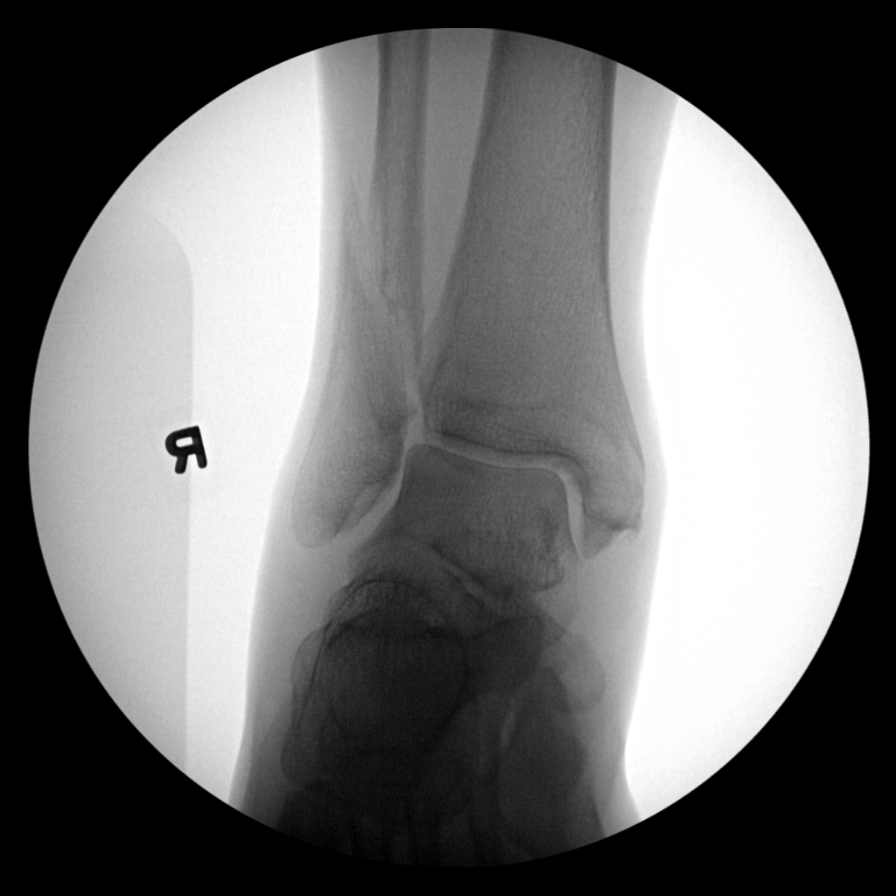
[im 3/7]
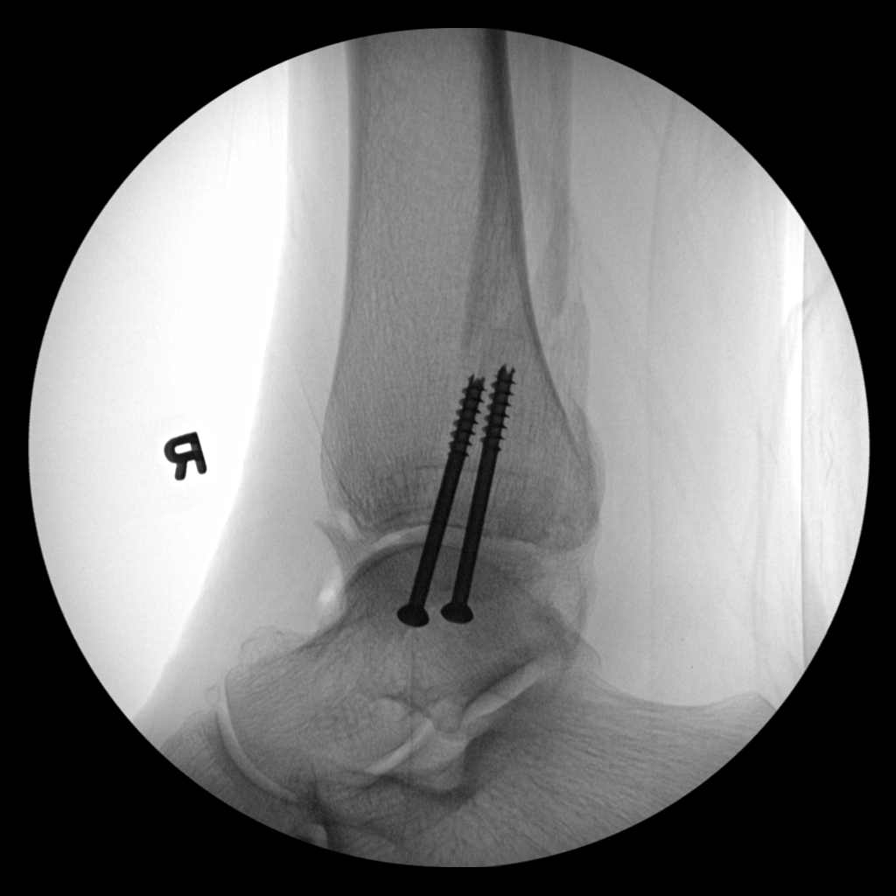
[im 4/7]
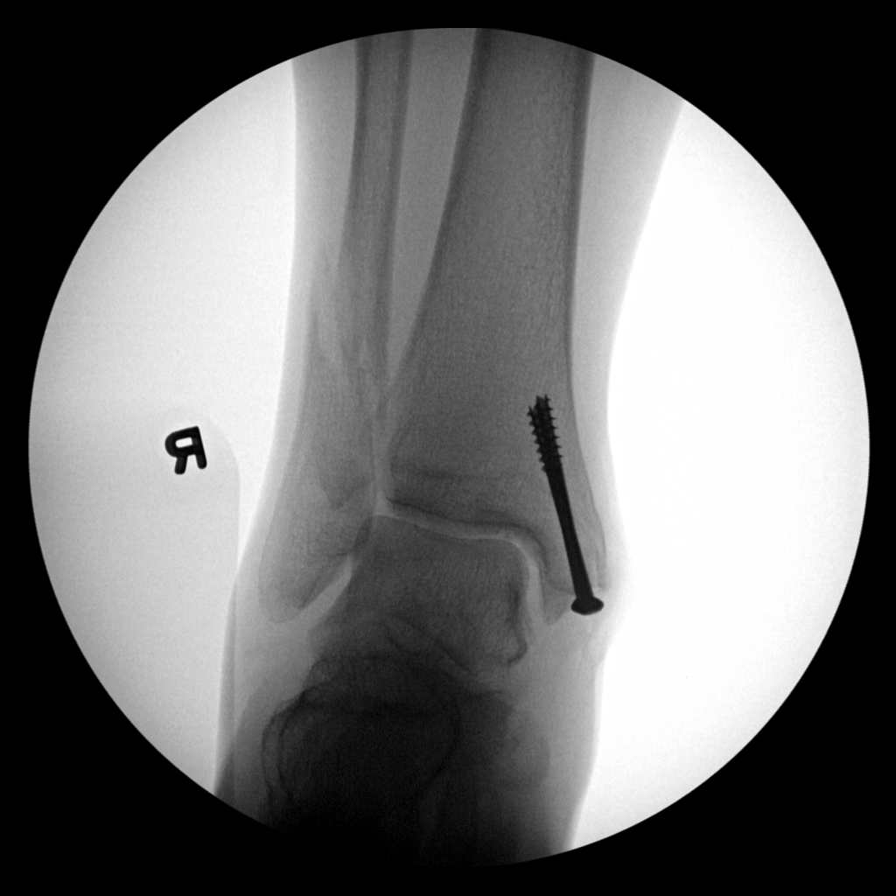
[im 5/7]
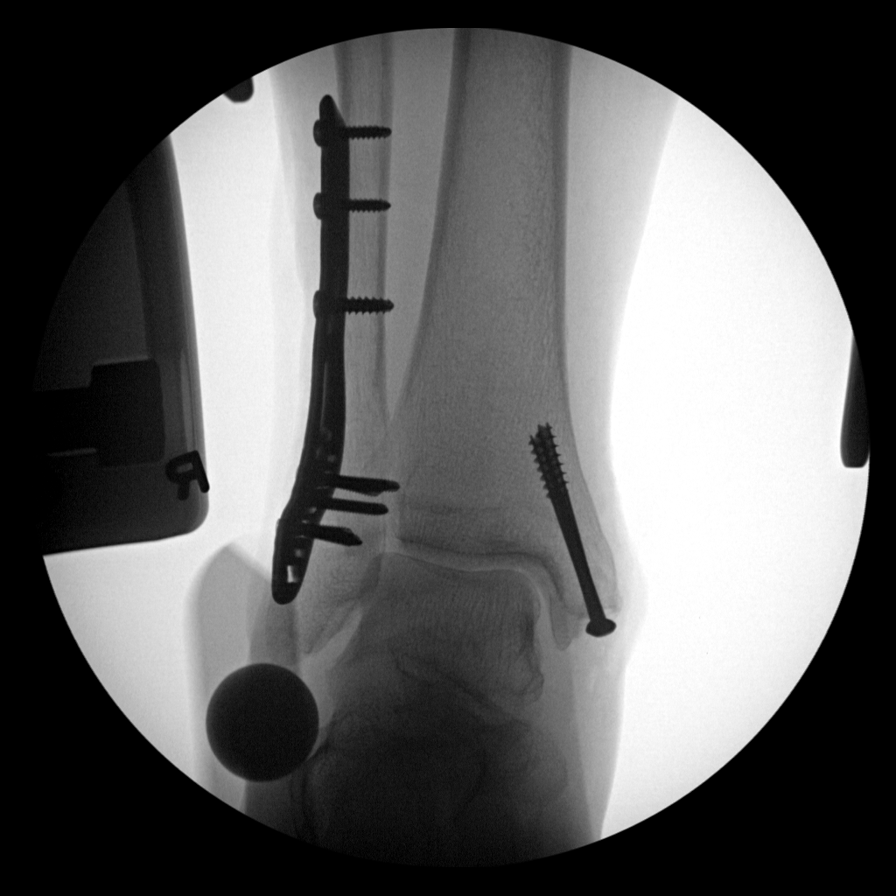
[im 6/7]
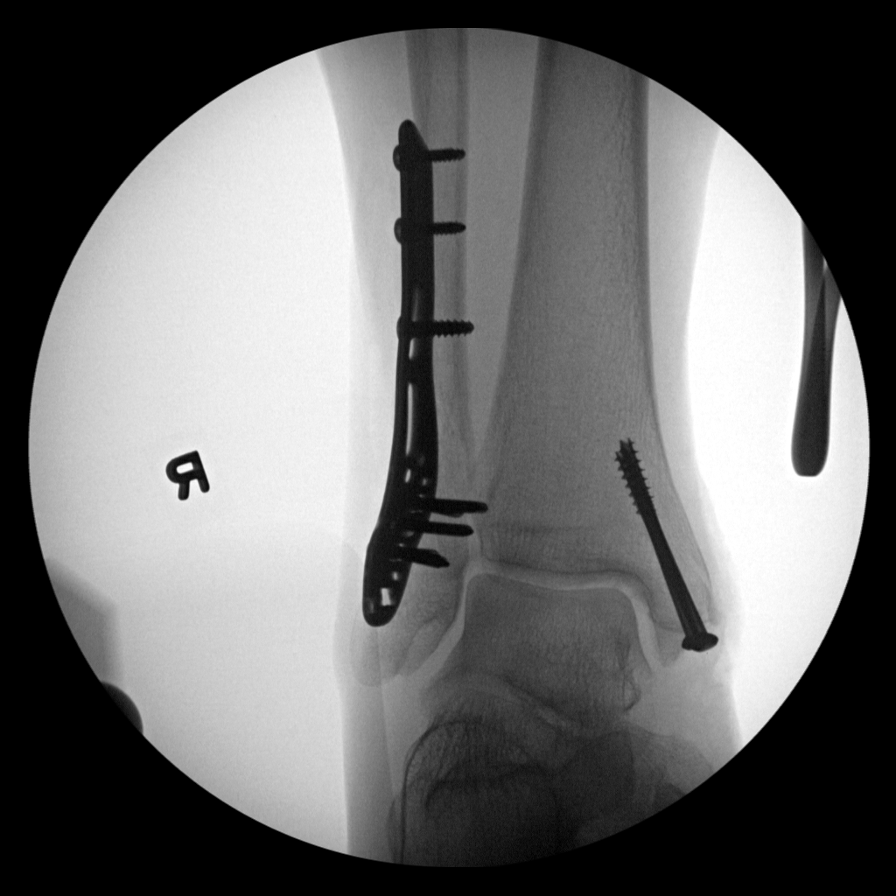
[im 7/7]
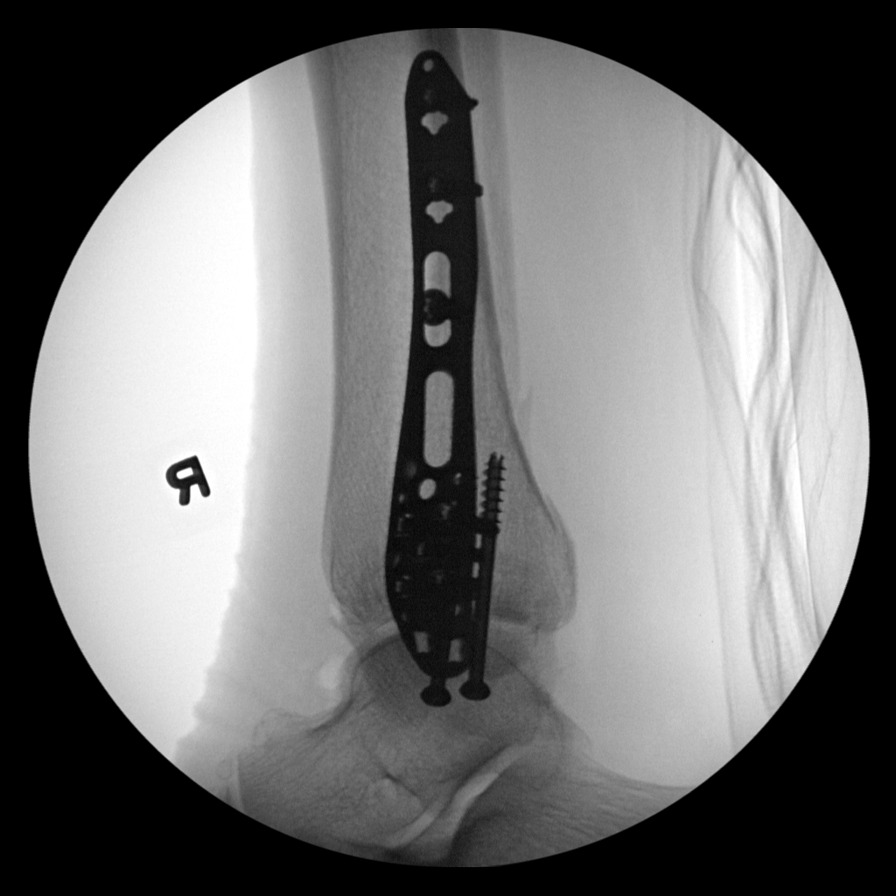

[7 of 7 positions shown; findings below may reference images not displayed]

FINDINGS: Multiple C-arm images demonstrate the patient undergoing open
reduction and internal fixation of medial and lateral malleolar
fractures. Alignment of the fracture fragments is almost anatomic
after reduction. Tiny posterior malleolar fracture of the tibia is
noted.
IMPRESSION: Open reduction and internal fixation of medial and lateral malleolar
fractures as described.

## 2019-01-14 DIAGNOSIS — R69 Illness, unspecified: Secondary | ICD-10-CM | POA: Diagnosis not present

## 2019-01-15 ENCOUNTER — Other Ambulatory Visit: Payer: Self-pay | Admitting: Internal Medicine

## 2019-01-15 DIAGNOSIS — Z1231 Encounter for screening mammogram for malignant neoplasm of breast: Secondary | ICD-10-CM

## 2019-02-27 ENCOUNTER — Ambulatory Visit
Admission: RE | Admit: 2019-02-27 | Discharge: 2019-02-27 | Disposition: A | Discharge status: Home / Self Care | Payer: Medicare HMO | Source: Ambulatory Visit | Attending: Internal Medicine | Admitting: Internal Medicine

## 2019-02-27 ENCOUNTER — Other Ambulatory Visit: Payer: Self-pay

## 2019-02-27 DIAGNOSIS — Z1231 Encounter for screening mammogram for malignant neoplasm of breast: Secondary | ICD-10-CM | POA: Diagnosis not present

## 2019-03-03 ENCOUNTER — Ambulatory Visit (INDEPENDENT_AMBULATORY_CARE_PROVIDER_SITE_OTHER): Payer: Medicare HMO | Admitting: *Deleted

## 2019-03-03 DIAGNOSIS — I472 Ventricular tachycardia: Secondary | ICD-10-CM | POA: Diagnosis not present

## 2019-03-03 DIAGNOSIS — I4729 Other ventricular tachycardia: Secondary | ICD-10-CM

## 2019-03-04 LAB — CUP PACEART REMOTE DEVICE CHECK
Battery Impedance: 1190 Ohm
Battery Remaining Longevity: 69 mo
Battery Voltage: 2.78 V
Brady Statistic AP VP Percent: 0 %
Brady Statistic AP VS Percent: 0 %
Brady Statistic AS VP Percent: 0 %
Brady Statistic AS VS Percent: 100 %
Date Time Interrogation Session: 20201118040424
Implantable Lead Implant Date: 20090930
Implantable Lead Implant Date: 20090930
Implantable Lead Location: 753859
Implantable Lead Location: 753860
Implantable Lead Model: 5076
Implantable Lead Model: 5076
Implantable Pulse Generator Implant Date: 20090930
Lead Channel Impedance Value: 418 Ohm
Lead Channel Impedance Value: 476 Ohm
Lead Channel Pacing Threshold Amplitude: 0.75 V
Lead Channel Pacing Threshold Amplitude: 1.125 V
Lead Channel Pacing Threshold Pulse Width: 0.4 ms
Lead Channel Pacing Threshold Pulse Width: 0.4 ms
Lead Channel Setting Pacing Amplitude: 2 V
Lead Channel Setting Pacing Amplitude: 2.25 V
Lead Channel Setting Pacing Pulse Width: 0.4 ms
Lead Channel Setting Sensing Sensitivity: 2 mV

## 2019-03-31 NOTE — Progress Notes (Signed)
Remote pacemaker transmission.   

## 2019-05-11 DIAGNOSIS — H524 Presbyopia: Secondary | ICD-10-CM | POA: Diagnosis not present

## 2019-05-11 DIAGNOSIS — H2513 Age-related nuclear cataract, bilateral: Secondary | ICD-10-CM | POA: Diagnosis not present

## 2019-05-11 DIAGNOSIS — H353132 Nonexudative age-related macular degeneration, bilateral, intermediate dry stage: Secondary | ICD-10-CM | POA: Diagnosis not present

## 2019-05-11 DIAGNOSIS — H52203 Unspecified astigmatism, bilateral: Secondary | ICD-10-CM | POA: Diagnosis not present

## 2019-05-11 DIAGNOSIS — H5203 Hypermetropia, bilateral: Secondary | ICD-10-CM | POA: Diagnosis not present

## 2019-05-12 DIAGNOSIS — R05 Cough: Secondary | ICD-10-CM | POA: Diagnosis not present

## 2019-05-12 DIAGNOSIS — I10 Essential (primary) hypertension: Secondary | ICD-10-CM | POA: Diagnosis not present

## 2019-06-09 ENCOUNTER — Ambulatory Visit (INDEPENDENT_AMBULATORY_CARE_PROVIDER_SITE_OTHER): Payer: Medicare HMO | Admitting: *Deleted

## 2019-06-09 DIAGNOSIS — I4729 Other ventricular tachycardia: Secondary | ICD-10-CM

## 2019-06-09 DIAGNOSIS — R05 Cough: Secondary | ICD-10-CM | POA: Diagnosis not present

## 2019-06-09 DIAGNOSIS — I472 Ventricular tachycardia: Secondary | ICD-10-CM | POA: Diagnosis not present

## 2019-06-09 DIAGNOSIS — I10 Essential (primary) hypertension: Secondary | ICD-10-CM | POA: Diagnosis not present

## 2019-06-09 LAB — CUP PACEART REMOTE DEVICE CHECK
Battery Impedance: 1294 Ohm
Battery Remaining Longevity: 66 mo
Battery Voltage: 2.78 V
Brady Statistic AP VP Percent: 0 %
Brady Statistic AP VS Percent: 0 %
Brady Statistic AS VP Percent: 0 %
Brady Statistic AS VS Percent: 100 %
Date Time Interrogation Session: 20210223101929
Implantable Lead Implant Date: 20090930
Implantable Lead Implant Date: 20090930
Implantable Lead Location: 753859
Implantable Lead Location: 753860
Implantable Lead Model: 5076
Implantable Lead Model: 5076
Implantable Pulse Generator Implant Date: 20090930
Lead Channel Impedance Value: 490 Ohm
Lead Channel Impedance Value: 549 Ohm
Lead Channel Pacing Threshold Amplitude: 0.75 V
Lead Channel Pacing Threshold Amplitude: 1 V
Lead Channel Pacing Threshold Pulse Width: 0.4 ms
Lead Channel Pacing Threshold Pulse Width: 0.4 ms
Lead Channel Setting Pacing Amplitude: 2 V
Lead Channel Setting Pacing Amplitude: 2 V
Lead Channel Setting Pacing Pulse Width: 0.4 ms
Lead Channel Setting Sensing Sensitivity: 2 mV

## 2019-06-10 NOTE — Progress Notes (Signed)
PPM Remote  

## 2019-06-11 ENCOUNTER — Ambulatory Visit: Payer: Medicare HMO | Attending: Internal Medicine

## 2019-06-11 DIAGNOSIS — Z23 Encounter for immunization: Secondary | ICD-10-CM | POA: Insufficient documentation

## 2019-06-11 NOTE — Progress Notes (Signed)
   Covid-19 Vaccination Clinic  Name:  Tiffany Baird    MRN: [id number] DOB: 01-03-1946  06/11/2019  Tiffany Baird was observed post Covid-19 immunization for 15 minutes without incidence. She was provided with Vaccine Information Sheet and instruction to access the V-Safe system.   Tiffany Baird was instructed to call 911 with any severe reactions post vaccine: Marland Kitchen Difficulty breathing  . Swelling of your face and throat  . A fast heartbeat  . A bad rash all over your body  . Dizziness and weakness    Immunizations Administered    Name Date Dose VIS Date Route   Pfizer COVID-19 Vaccine 06/11/2019  8:21 AM 0.3 mL 03/27/2019 Intramuscular   Manufacturer: [place]   Lot: [id number]   [place]: [id number]

## 2019-06-24 DIAGNOSIS — D225 Melanocytic nevi of trunk: Secondary | ICD-10-CM | POA: Diagnosis not present

## 2019-06-24 DIAGNOSIS — D485 Neoplasm of uncertain behavior of skin: Secondary | ICD-10-CM | POA: Diagnosis not present

## 2019-06-24 DIAGNOSIS — D224 Melanocytic nevi of scalp and neck: Secondary | ICD-10-CM | POA: Diagnosis not present

## 2019-06-24 DIAGNOSIS — L821 Other seborrheic keratosis: Secondary | ICD-10-CM | POA: Diagnosis not present

## 2019-06-24 DIAGNOSIS — L820 Inflamed seborrheic keratosis: Secondary | ICD-10-CM | POA: Diagnosis not present

## 2019-06-24 DIAGNOSIS — D1801 Hemangioma of skin and subcutaneous tissue: Secondary | ICD-10-CM | POA: Diagnosis not present

## 2019-06-24 DIAGNOSIS — L918 Other hypertrophic disorders of the skin: Secondary | ICD-10-CM | POA: Diagnosis not present

## 2019-06-30 DIAGNOSIS — R69 Illness, unspecified: Secondary | ICD-10-CM | POA: Diagnosis not present

## 2019-07-08 ENCOUNTER — Ambulatory Visit: Payer: Medicare HMO | Attending: Internal Medicine

## 2019-07-08 DIAGNOSIS — Z23 Encounter for immunization: Secondary | ICD-10-CM

## 2019-07-08 NOTE — Progress Notes (Signed)
   Covid-19 Vaccination Clinic  Name:  Tiffany Baird    MRN: [id number] DOB: 08/02/1945  07/08/2019  [name] was observed post Covid-19 immunization for 15 minutes without incident. She was provided with Vaccine Information Sheet and instruction to access the V-Safe system.   [name] was instructed to call 911 with any severe reactions post vaccine: Marland Kitchen Difficulty breathing  . Swelling of face and throat  . A fast heartbeat  . A bad rash all over body  . Dizziness and weakness   Immunizations Administered    Name Date Dose VIS Date Route   Pfizer COVID-19 Vaccine 07/08/2019  9:01 AM 0.3 mL 03/27/2019 Intramuscular   Manufacturer: [place]   Lot: [id number]   [place]: [id number]

## 2019-08-10 DIAGNOSIS — M79672 Pain in left foot: Secondary | ICD-10-CM | POA: Diagnosis not present

## 2019-08-10 DIAGNOSIS — I10 Essential (primary) hypertension: Secondary | ICD-10-CM | POA: Diagnosis not present

## 2019-09-09 ENCOUNTER — Telehealth: Payer: Self-pay

## 2019-09-09 NOTE — Telephone Encounter (Signed)
Left message for patient to remind of missed remote transmission.  

## 2019-09-28 ENCOUNTER — Ambulatory Visit (INDEPENDENT_AMBULATORY_CARE_PROVIDER_SITE_OTHER): Payer: Medicare HMO | Admitting: *Deleted

## 2019-09-28 DIAGNOSIS — I472 Ventricular tachycardia: Secondary | ICD-10-CM | POA: Diagnosis not present

## 2019-09-28 DIAGNOSIS — I4729 Other ventricular tachycardia: Secondary | ICD-10-CM

## 2019-09-28 LAB — CUP PACEART REMOTE DEVICE CHECK
Battery Impedance: 1375 Ohm
Battery Remaining Longevity: 62 mo
Battery Voltage: 2.78 V
Brady Statistic AP VP Percent: 0 %
Brady Statistic AP VS Percent: 0 %
Brady Statistic AS VP Percent: 0 %
Brady Statistic AS VS Percent: 100 %
Date Time Interrogation Session: 20210614100051
Implantable Lead Implant Date: 20090930
Implantable Lead Implant Date: 20090930
Implantable Lead Location: 753859
Implantable Lead Location: 753860
Implantable Lead Model: 5076
Implantable Lead Model: 5076
Implantable Pulse Generator Implant Date: 20090930
Lead Channel Impedance Value: 448 Ohm
Lead Channel Impedance Value: 484 Ohm
Lead Channel Pacing Threshold Amplitude: 0.75 V
Lead Channel Pacing Threshold Amplitude: 1.125 V
Lead Channel Pacing Threshold Pulse Width: 0.4 ms
Lead Channel Pacing Threshold Pulse Width: 0.4 ms
Lead Channel Setting Pacing Amplitude: 2 V
Lead Channel Setting Pacing Amplitude: 2.25 V
Lead Channel Setting Pacing Pulse Width: 0.4 ms
Lead Channel Setting Sensing Sensitivity: 2 mV

## 2019-09-30 NOTE — Progress Notes (Signed)
Remote pacemaker transmission.   

## 2019-12-28 ENCOUNTER — Ambulatory Visit (INDEPENDENT_AMBULATORY_CARE_PROVIDER_SITE_OTHER): Payer: Medicare HMO | Admitting: *Deleted

## 2019-12-28 DIAGNOSIS — I495 Sick sinus syndrome: Secondary | ICD-10-CM

## 2019-12-30 LAB — CUP PACEART REMOTE DEVICE CHECK
Battery Impedance: 1428 Ohm
Battery Remaining Longevity: 61 mo
Battery Voltage: 2.77 V
Brady Statistic AP VP Percent: 0 %
Brady Statistic AP VS Percent: 0 %
Brady Statistic AS VP Percent: 0 %
Brady Statistic AS VS Percent: 100 %
Date Time Interrogation Session: 20210913094849
Implantable Lead Implant Date: 20090930
Implantable Lead Implant Date: 20090930
Implantable Lead Location: 753859
Implantable Lead Location: 753860
Implantable Lead Model: 5076
Implantable Lead Model: 5076
Implantable Pulse Generator Implant Date: 20090930
Lead Channel Impedance Value: 476 Ohm
Lead Channel Impedance Value: 490 Ohm
Lead Channel Pacing Threshold Amplitude: 0.625 V
Lead Channel Pacing Threshold Amplitude: 1.125 V
Lead Channel Pacing Threshold Pulse Width: 0.4 ms
Lead Channel Pacing Threshold Pulse Width: 0.4 ms
Lead Channel Setting Pacing Amplitude: 2 V
Lead Channel Setting Pacing Amplitude: 2.25 V
Lead Channel Setting Pacing Pulse Width: 0.4 ms
Lead Channel Setting Sensing Sensitivity: 2 mV

## 2019-12-30 NOTE — Progress Notes (Signed)
Remote pacemaker transmission.   

## 2020-01-05 DIAGNOSIS — R69 Illness, unspecified: Secondary | ICD-10-CM | POA: Diagnosis not present

## 2020-01-25 DIAGNOSIS — R69 Illness, unspecified: Secondary | ICD-10-CM | POA: Diagnosis not present

## 2020-01-25 DIAGNOSIS — K219 Gastro-esophageal reflux disease without esophagitis: Secondary | ICD-10-CM | POA: Diagnosis not present

## 2020-01-25 DIAGNOSIS — I10 Essential (primary) hypertension: Secondary | ICD-10-CM | POA: Diagnosis not present

## 2020-01-25 DIAGNOSIS — E7800 Pure hypercholesterolemia, unspecified: Secondary | ICD-10-CM | POA: Diagnosis not present

## 2020-01-27 DIAGNOSIS — M8580 Other specified disorders of bone density and structure, unspecified site: Secondary | ICD-10-CM | POA: Diagnosis not present

## 2020-01-27 DIAGNOSIS — M533 Sacrococcygeal disorders, not elsewhere classified: Secondary | ICD-10-CM | POA: Diagnosis not present

## 2020-01-27 DIAGNOSIS — Z0000 Encounter for general adult medical examination without abnormal findings: Secondary | ICD-10-CM | POA: Diagnosis not present

## 2020-01-27 DIAGNOSIS — K219 Gastro-esophageal reflux disease without esophagitis: Secondary | ICD-10-CM | POA: Diagnosis not present

## 2020-01-27 DIAGNOSIS — M545 Low back pain, unspecified: Secondary | ICD-10-CM | POA: Diagnosis not present

## 2020-01-27 DIAGNOSIS — Z950 Presence of cardiac pacemaker: Secondary | ICD-10-CM | POA: Diagnosis not present

## 2020-01-27 DIAGNOSIS — M160 Bilateral primary osteoarthritis of hip: Secondary | ICD-10-CM | POA: Diagnosis not present

## 2020-01-27 DIAGNOSIS — I10 Essential (primary) hypertension: Secondary | ICD-10-CM | POA: Diagnosis not present

## 2020-01-27 DIAGNOSIS — G43009 Migraine without aura, not intractable, without status migrainosus: Secondary | ICD-10-CM | POA: Diagnosis not present

## 2020-01-27 DIAGNOSIS — R102 Pelvic and perineal pain: Secondary | ICD-10-CM | POA: Diagnosis not present

## 2020-01-27 DIAGNOSIS — E7800 Pure hypercholesterolemia, unspecified: Secondary | ICD-10-CM | POA: Diagnosis not present

## 2020-01-27 DIAGNOSIS — Z23 Encounter for immunization: Secondary | ICD-10-CM | POA: Diagnosis not present

## 2020-02-08 DIAGNOSIS — Z23 Encounter for immunization: Secondary | ICD-10-CM | POA: Diagnosis not present

## 2020-02-08 DIAGNOSIS — S61411A Laceration without foreign body of right hand, initial encounter: Secondary | ICD-10-CM | POA: Diagnosis not present

## 2020-02-10 ENCOUNTER — Other Ambulatory Visit: Payer: Self-pay

## 2020-02-10 ENCOUNTER — Ambulatory Visit: Payer: Medicare HMO | Admitting: Family Medicine

## 2020-02-10 ENCOUNTER — Encounter: Payer: Self-pay | Admitting: Family Medicine

## 2020-02-10 VITALS — BP 120/82 | HR 92 | Ht 65.0 in | Wt 222.8 lb

## 2020-02-10 DIAGNOSIS — M545 Low back pain, unspecified: Secondary | ICD-10-CM | POA: Diagnosis not present

## 2020-02-10 DIAGNOSIS — G8929 Other chronic pain: Secondary | ICD-10-CM

## 2020-02-10 NOTE — Patient Instructions (Addendum)
Thank you for coming in today.  Plan for PT.  We will pick a location however if you find somewhere else that is better for you please let me know.  Use heat and TENS unit as needed.   Recheck in 6-8 weeks especially if not better. Can do injection at that point if needed.   Let me know if you have a problem.   Keep me updated.

## 2020-02-10 NOTE — Progress Notes (Signed)
Subjective:    CC: Low back and sacral pain  I, Molly Weber, LAT, ATC, am serving as scribe for Dr. Lynne Leader.  HPI: Pt is a 74 y/o female presenting w/ c/o midline low back and sacral pain x several months.  Pt states that she fx her coccyx in 1980.  Radiating pain: no LE numbness/tingling: No LE weakness: No Aggravating factors: prolonged sitting: transitioning from supine to standing and first few minutes upon standing; bending and stooping Treatments tried: heat, IBU  Diagnostic imaging: L-spine XR at Dr. Pennie Banter office Oct 2021  Patient lives part-time to most of the time in [facility] area.  Pertinent review of Systems: No fevers or chills  Relevant historical information: Hypertension   Objective:    Vitals:   02/10/20 1356  BP: 120/82  Pulse: 92  SpO2: 96%   General: Well Developed, well nourished, and in no acute distress.   MSK: L-spine normal-appearing nontender midline.  Tender palpation right SI joint. Normal lumbar motion. Lower extremity strength reflexes and sensation are equal and normal throughout. Hips bilaterally normal-appearing normal motion.  Lab and Radiology Results   EXAM: LUMBAR SPINE - 2-3 VIEW  COMPARISON: None.  FINDINGS: Mild facet degenerative changes in the mid and lower lumbar spine. Disc space narrowing at L4-5 and L5-S1. 9 mm of anterolisthesis of L4 on L5. No fracture.  IMPRESSION: Degenerative disc and facet disease as above. No acute bony abnormality.   Electronically Signed By: Rolm Baptise M.D. On: 01/29/2020 16:26  EXAM: DG HIP (WITH OR WITHOUT PELVIS) 3-4V BILAT  COMPARISON: None.  FINDINGS: Mild degenerative changes in the hips bilaterally with joint space narrowing and early spurring. Slight spurring along the inferior left SI joint. No acute bony abnormality. Specifically, no fracture, subluxation, or dislocation. No focal bone lesion.  IMPRESSION: Mild degenerative  changes in the hips bilaterally. No acute bony abnormality.   Electronically Signed By: Rolm Baptise M.D. On: 01/29/2020 16:23  EXAM: BILATERAL SACROILIAC JOINTS - 3+ VIEW  COMPARISON: None.  FINDINGS: Mild spurring along the inferior left SI joint. SI joints otherwise are symmetric. No acute bony abnormality or focal bone lesion.  IMPRESSION: Mild spurring along the inferior left SI joint. No acute bony abnormality.   Electronically Signed By: Rolm Baptise M.D. On: 01/29/2020 16:23  I, Lynne Leader, personally (independently) visualized and performed the interpretation of the images attached in this note. L-spine DDD.  Mild hip DJD bilaterally. Mild spurring left SI joint   Impression and Recommendations:    Assessment and Plan: 74 y.o. female with right low back pain.  Pain focused at [place] joint on the right side.  Pain certainly could be SI joint dysfunction however on x-ray her left SI joint left worse than the right.  Most likely explanation is muscle spasm and dysfunction.  Plan for trial of physical therapy.  Since she lives most of the time in [place] in [place] area will refer to physical therapy there.  She will confirmed the location that we picked and notify me if we need to change the location in the next few days.  Check back in 6 to 8 weeks.  If not improved would consider right SI joint injection.Marland Kitchen  PDMP not reviewed this encounter. Orders Placed This Encounter  Procedures  . Ambulatory referral to Physical Therapy    Referral Priority:   Routine    Referral Type:   Physical Medicine    Referral Reason:   Specialty Services  Required    Requested Specialty:   Physical Therapy    Number of Visits Requested:   1   No orders of the defined types were placed in this encounter.   Discussed warning signs or symptoms. Please see discharge instructions. Patient expresses understanding.   The above documentation has been reviewed and is accurate and  complete Lynne Leader, M.D.

## 2020-02-22 ENCOUNTER — Telehealth: Payer: Self-pay | Admitting: Family Medicine

## 2020-02-22 NOTE — Telephone Encounter (Signed)
Patient would like her PT orders sent to a different place. She is requesting them to be sent to: [facility]  [address] [place] [address] Phone: [phone] Fax: [phone]

## 2020-02-22 NOTE — Telephone Encounter (Signed)
Has been faxed to place below as requested

## 2020-02-24 DIAGNOSIS — G8929 Other chronic pain: Secondary | ICD-10-CM

## 2020-03-28 ENCOUNTER — Ambulatory Visit (INDEPENDENT_AMBULATORY_CARE_PROVIDER_SITE_OTHER): Payer: Medicare HMO

## 2020-03-28 DIAGNOSIS — Z6838 Body mass index (BMI) 38.0-38.9, adult: Secondary | ICD-10-CM | POA: Diagnosis not present

## 2020-03-28 DIAGNOSIS — I495 Sick sinus syndrome: Secondary | ICD-10-CM | POA: Diagnosis not present

## 2020-03-28 DIAGNOSIS — Z01419 Encounter for gynecological examination (general) (routine) without abnormal findings: Secondary | ICD-10-CM | POA: Diagnosis not present

## 2020-03-29 ENCOUNTER — Other Ambulatory Visit: Payer: Self-pay | Admitting: Obstetrics & Gynecology

## 2020-03-29 DIAGNOSIS — Z01419 Encounter for gynecological examination (general) (routine) without abnormal findings: Secondary | ICD-10-CM | POA: Diagnosis not present

## 2020-03-29 DIAGNOSIS — Z1231 Encounter for screening mammogram for malignant neoplasm of breast: Secondary | ICD-10-CM

## 2020-03-29 LAB — CUP PACEART REMOTE DEVICE CHECK
Battery Impedance: 1457 Ohm
Battery Remaining Longevity: 60 mo
Battery Voltage: 2.77 V
Brady Statistic AP VP Percent: 0 %
Brady Statistic AP VS Percent: 0 %
Brady Statistic AS VP Percent: 0 %
Brady Statistic AS VS Percent: 100 %
Date Time Interrogation Session: 20211214122110
Implantable Lead Implant Date: 20090930
Implantable Lead Implant Date: 20090930
Implantable Lead Location: 753859
Implantable Lead Location: 753860
Implantable Lead Model: 5076
Implantable Lead Model: 5076
Implantable Pulse Generator Implant Date: 20090930
Lead Channel Impedance Value: 453 Ohm
Lead Channel Impedance Value: 497 Ohm
Lead Channel Pacing Threshold Amplitude: 0.75 V
Lead Channel Pacing Threshold Amplitude: 1 V
Lead Channel Pacing Threshold Pulse Width: 0.4 ms
Lead Channel Pacing Threshold Pulse Width: 0.4 ms
Lead Channel Setting Pacing Amplitude: 2 V
Lead Channel Setting Pacing Amplitude: 2 V
Lead Channel Setting Pacing Pulse Width: 0.4 ms
Lead Channel Setting Sensing Sensitivity: 2 mV

## 2020-03-30 DIAGNOSIS — M5136 Other intervertebral disc degeneration, lumbar region: Secondary | ICD-10-CM | POA: Diagnosis not present

## 2020-03-30 DIAGNOSIS — M545 Low back pain, unspecified: Secondary | ICD-10-CM | POA: Diagnosis not present

## 2020-03-30 DIAGNOSIS — G8929 Other chronic pain: Secondary | ICD-10-CM | POA: Diagnosis not present

## 2020-03-30 DIAGNOSIS — M4316 Spondylolisthesis, lumbar region: Secondary | ICD-10-CM | POA: Diagnosis not present

## 2020-04-11 ENCOUNTER — Other Ambulatory Visit: Payer: Self-pay

## 2020-04-11 ENCOUNTER — Ambulatory Visit
Admission: RE | Admit: 2020-04-11 | Discharge: 2020-04-11 | Disposition: A | Discharge status: Home / Self Care | Payer: Medicare HMO | Source: Ambulatory Visit | Attending: Obstetrics & Gynecology | Admitting: Obstetrics & Gynecology

## 2020-04-11 DIAGNOSIS — Z1231 Encounter for screening mammogram for malignant neoplasm of breast: Secondary | ICD-10-CM | POA: Diagnosis not present

## 2020-04-12 NOTE — Progress Notes (Signed)
Remote pacemaker transmission.   

## 2020-04-18 DIAGNOSIS — G8929 Other chronic pain: Secondary | ICD-10-CM | POA: Diagnosis not present

## 2020-04-18 DIAGNOSIS — M545 Low back pain, unspecified: Secondary | ICD-10-CM | POA: Diagnosis not present

## 2020-04-18 DIAGNOSIS — M5136 Other intervertebral disc degeneration, lumbar region: Secondary | ICD-10-CM | POA: Diagnosis not present

## 2020-04-18 DIAGNOSIS — M4316 Spondylolisthesis, lumbar region: Secondary | ICD-10-CM | POA: Diagnosis not present

## 2020-04-27 DIAGNOSIS — M5136 Other intervertebral disc degeneration, lumbar region: Secondary | ICD-10-CM | POA: Diagnosis not present

## 2020-04-27 DIAGNOSIS — M4316 Spondylolisthesis, lumbar region: Secondary | ICD-10-CM | POA: Diagnosis not present

## 2020-04-27 DIAGNOSIS — G8929 Other chronic pain: Secondary | ICD-10-CM | POA: Diagnosis not present

## 2020-04-27 DIAGNOSIS — M545 Low back pain, unspecified: Secondary | ICD-10-CM | POA: Diagnosis not present

## 2020-06-27 ENCOUNTER — Ambulatory Visit (INDEPENDENT_AMBULATORY_CARE_PROVIDER_SITE_OTHER): Payer: Medicare HMO

## 2020-06-27 DIAGNOSIS — I495 Sick sinus syndrome: Secondary | ICD-10-CM | POA: Diagnosis not present

## 2020-06-28 LAB — CUP PACEART REMOTE DEVICE CHECK
Battery Impedance: 1565 Ohm
Battery Remaining Longevity: 57 mo
Battery Voltage: 2.77 V
Brady Statistic AP VP Percent: 0 %
Brady Statistic AP VS Percent: 0 %
Brady Statistic AS VP Percent: 0 %
Brady Statistic AS VS Percent: 100 %
Date Time Interrogation Session: 20220314225523
Implantable Lead Implant Date: 20090930
Implantable Lead Implant Date: 20090930
Implantable Lead Location: 753859
Implantable Lead Location: 753860
Implantable Lead Model: 5076
Implantable Lead Model: 5076
Implantable Pulse Generator Implant Date: 20090930
Lead Channel Impedance Value: 491 Ohm
Lead Channel Impedance Value: 527 Ohm
Lead Channel Pacing Threshold Amplitude: 0.75 V
Lead Channel Pacing Threshold Amplitude: 0.875 V
Lead Channel Pacing Threshold Pulse Width: 0.4 ms
Lead Channel Pacing Threshold Pulse Width: 0.4 ms
Lead Channel Setting Pacing Amplitude: 1.875
Lead Channel Setting Pacing Amplitude: 2 V
Lead Channel Setting Pacing Pulse Width: 0.4 ms
Lead Channel Setting Sensing Sensitivity: 2 mV

## 2020-07-04 DIAGNOSIS — H52203 Unspecified astigmatism, bilateral: Secondary | ICD-10-CM | POA: Diagnosis not present

## 2020-07-04 DIAGNOSIS — H353132 Nonexudative age-related macular degeneration, bilateral, intermediate dry stage: Secondary | ICD-10-CM | POA: Diagnosis not present

## 2020-07-04 DIAGNOSIS — H5203 Hypermetropia, bilateral: Secondary | ICD-10-CM | POA: Diagnosis not present

## 2020-07-04 DIAGNOSIS — H524 Presbyopia: Secondary | ICD-10-CM | POA: Diagnosis not present

## 2020-07-04 DIAGNOSIS — H2513 Age-related nuclear cataract, bilateral: Secondary | ICD-10-CM | POA: Diagnosis not present

## 2020-07-05 NOTE — Progress Notes (Signed)
Remote pacemaker transmission.   

## 2020-07-07 DIAGNOSIS — L570 Actinic keratosis: Secondary | ICD-10-CM | POA: Diagnosis not present

## 2020-07-07 DIAGNOSIS — L918 Other hypertrophic disorders of the skin: Secondary | ICD-10-CM | POA: Diagnosis not present

## 2020-07-07 DIAGNOSIS — D225 Melanocytic nevi of trunk: Secondary | ICD-10-CM | POA: Diagnosis not present

## 2020-07-07 DIAGNOSIS — L814 Other melanin hyperpigmentation: Secondary | ICD-10-CM | POA: Diagnosis not present

## 2020-07-07 DIAGNOSIS — L821 Other seborrheic keratosis: Secondary | ICD-10-CM | POA: Diagnosis not present

## 2020-07-25 DIAGNOSIS — M1611 Unilateral primary osteoarthritis, right hip: Secondary | ICD-10-CM | POA: Diagnosis not present

## 2020-08-23 IMAGING — MG DIGITAL SCREENING BILATERAL MAMMOGRAM WITH TOMO AND CAD
8 series · 8 of 24 positions shown · non-contrast
Comparison: Previous exam(s).

CLINICAL DATA: Screening.

EXAM:
DIGITAL SCREENING BILATERAL MAMMOGRAM WITH TOMO AND CAD

[R CC synth-2D]
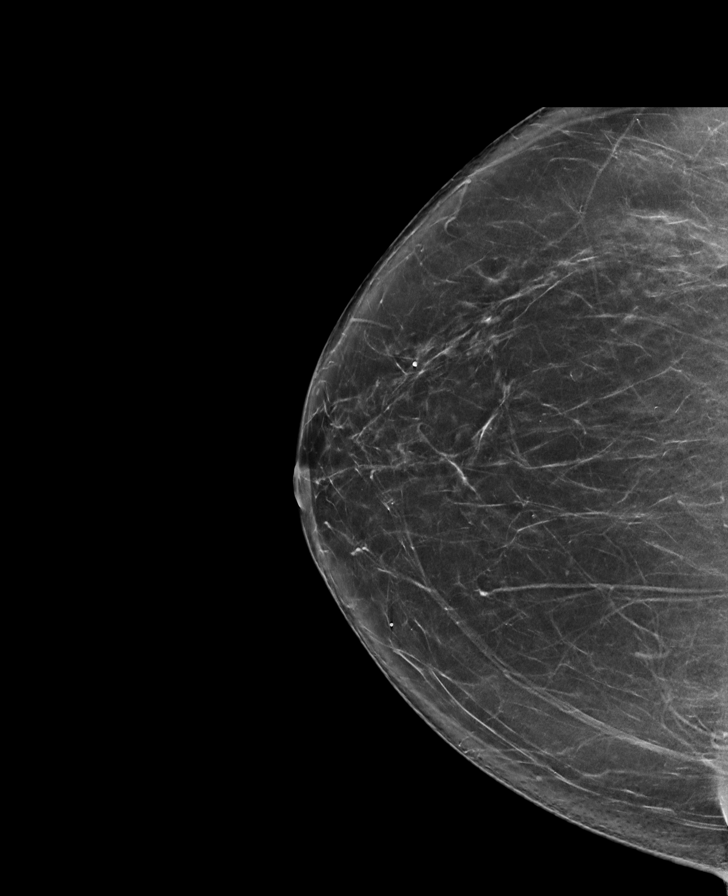

[L MLO synth-2D]
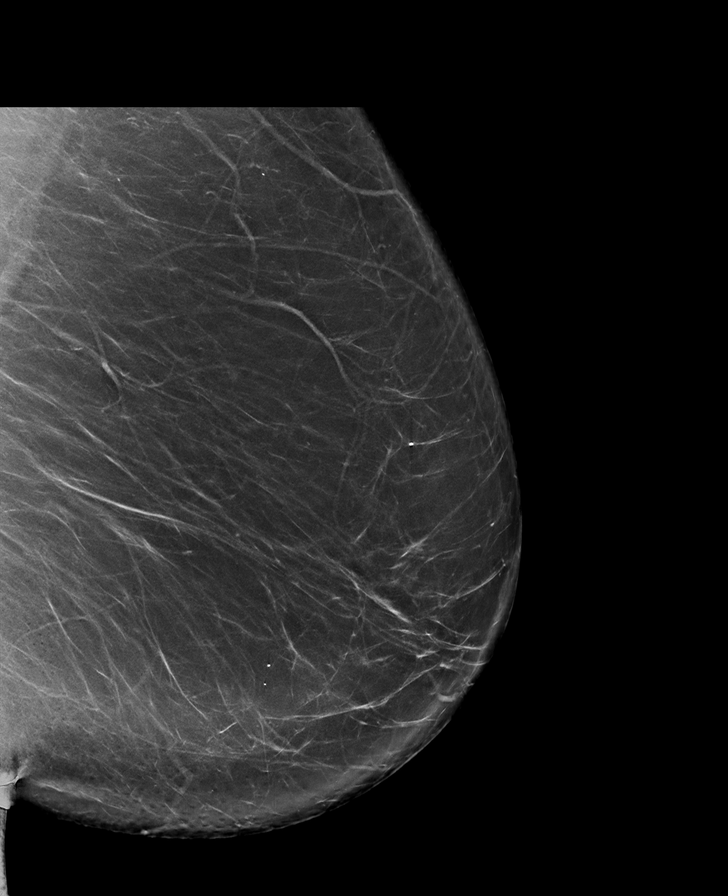

[L CC synth-2D]
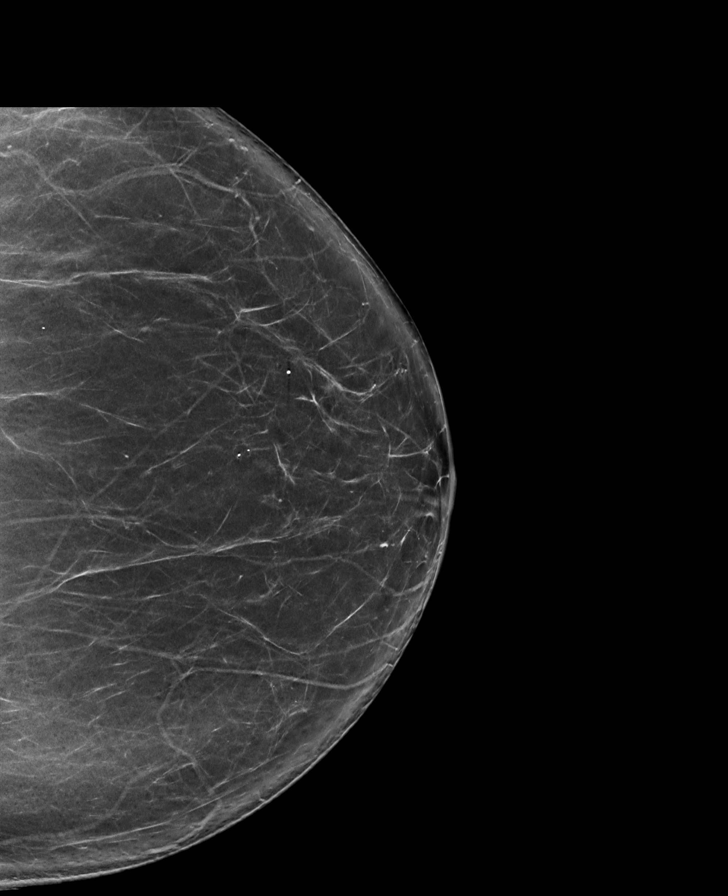

[R MLO synth-2D]
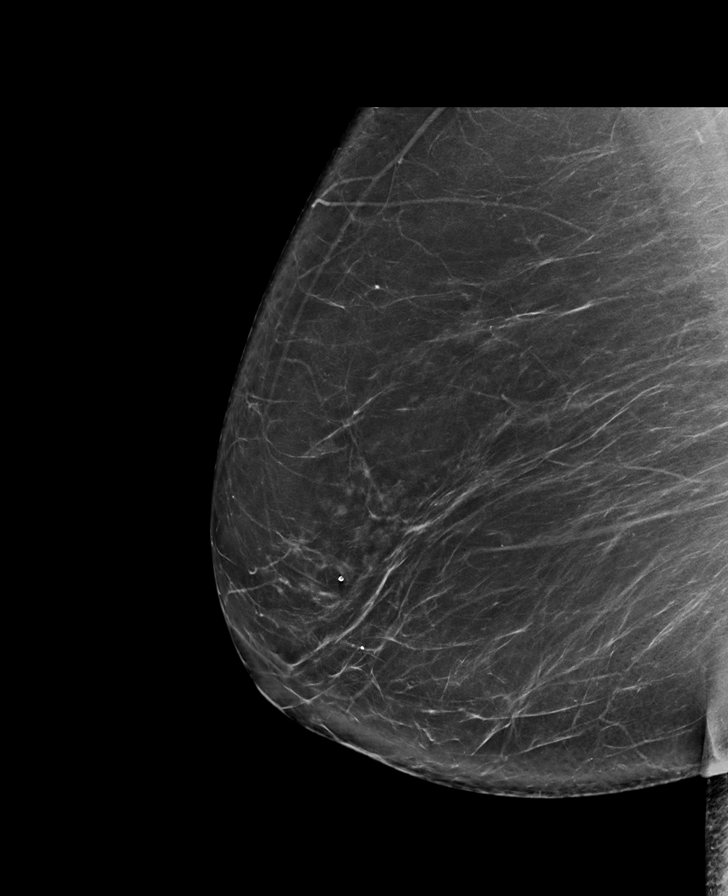

[R MLO tomo · tomo slice 47/92.0]
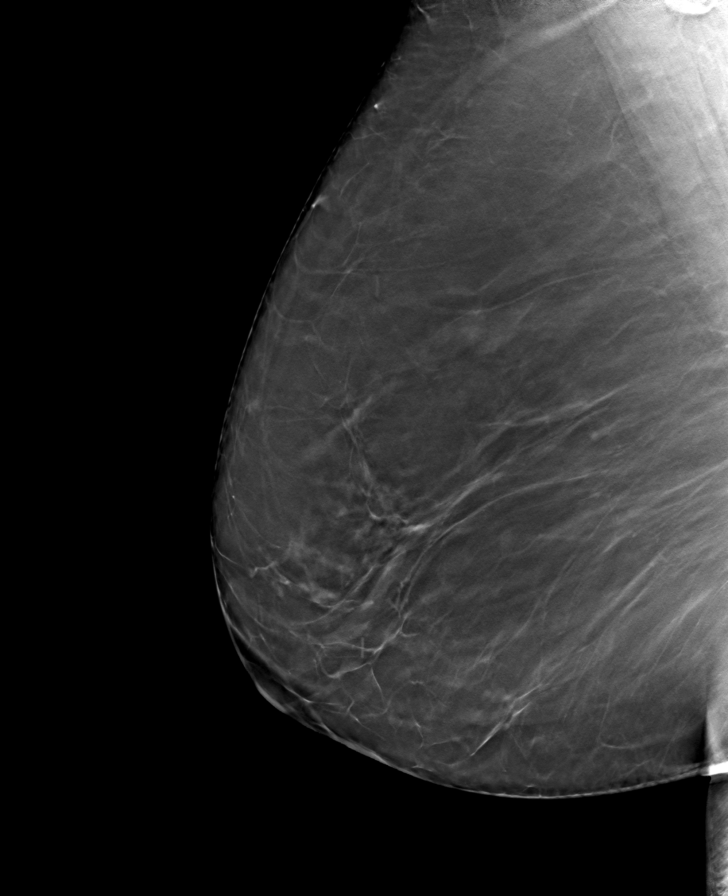

[R CC tomo · tomo slice 41/81.0]
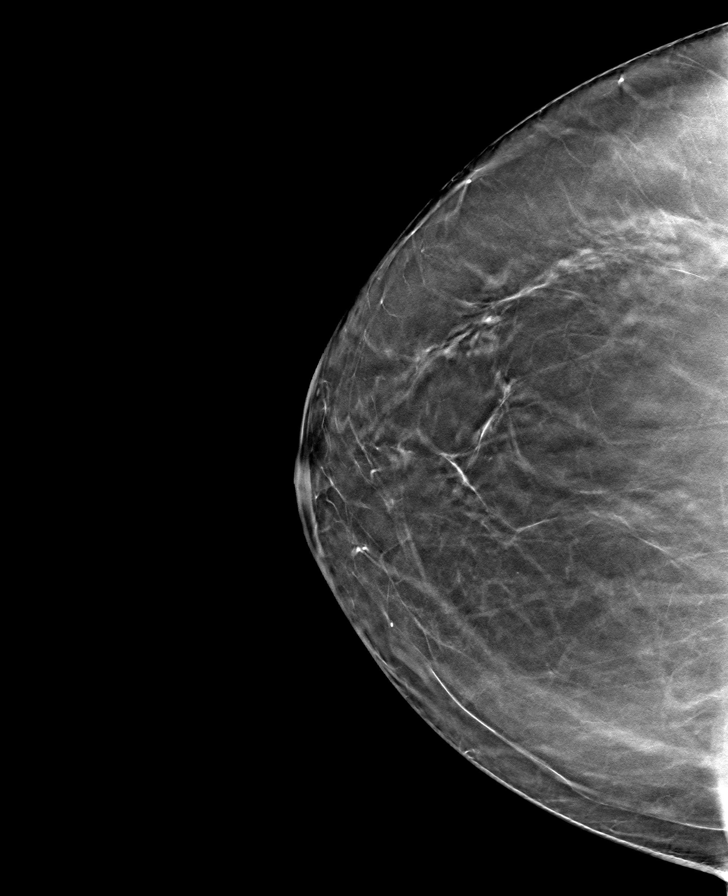

[L CC tomo · tomo slice 44/87.0]
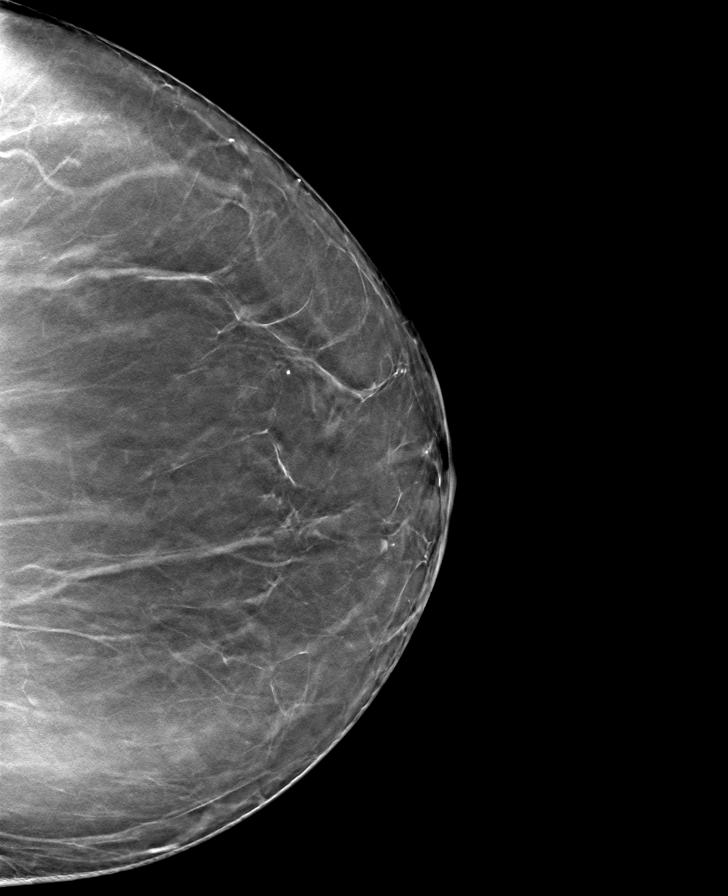

[L MLO tomo · tomo slice 50/99.0]
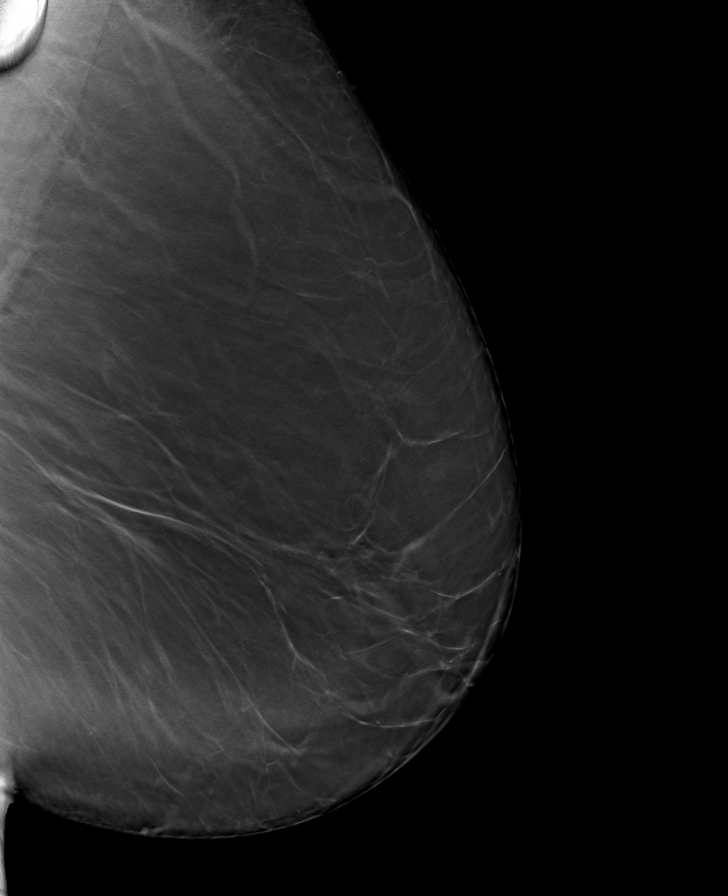

[8 of 24 positions shown; findings below may reference images not displayed]

ACR Breast Density Category b: There are scattered areas of
fibroglandular density.
FINDINGS: There are no findings suspicious for malignancy. Images were
processed with CAD.
IMPRESSION: No mammographic evidence of malignancy. A result letter of this
screening mammogram will be mailed directly to the patient.

RECOMMENDATION:
Screening mammogram in one year. (Code:CN-U-775)

BI-RADS CATEGORY  1: Negative.

## 2020-09-26 ENCOUNTER — Ambulatory Visit (INDEPENDENT_AMBULATORY_CARE_PROVIDER_SITE_OTHER): Payer: Medicare HMO

## 2020-09-26 DIAGNOSIS — I495 Sick sinus syndrome: Secondary | ICD-10-CM | POA: Diagnosis not present

## 2020-09-28 DIAGNOSIS — H25813 Combined forms of age-related cataract, bilateral: Secondary | ICD-10-CM | POA: Diagnosis not present

## 2020-09-28 DIAGNOSIS — H353132 Nonexudative age-related macular degeneration, bilateral, intermediate dry stage: Secondary | ICD-10-CM | POA: Diagnosis not present

## 2020-10-02 LAB — CUP PACEART REMOTE DEVICE CHECK
Battery Impedance: 1619 Ohm
Battery Remaining Longevity: 55 mo
Battery Voltage: 2.77 V
Brady Statistic AP VP Percent: 0 %
Brady Statistic AP VS Percent: 0 %
Brady Statistic AS VP Percent: 0 %
Brady Statistic AS VS Percent: 100 %
Date Time Interrogation Session: 20220617095856
Implantable Lead Implant Date: 20090930
Implantable Lead Implant Date: 20090930
Implantable Lead Location: 753859
Implantable Lead Location: 753860
Implantable Lead Model: 5076
Implantable Lead Model: 5076
Implantable Pulse Generator Implant Date: 20090930
Lead Channel Impedance Value: 498 Ohm
Lead Channel Impedance Value: 500 Ohm
Lead Channel Pacing Threshold Amplitude: 0.75 V
Lead Channel Pacing Threshold Amplitude: 1 V
Lead Channel Pacing Threshold Pulse Width: 0.4 ms
Lead Channel Pacing Threshold Pulse Width: 0.4 ms
Lead Channel Setting Pacing Amplitude: 2 V
Lead Channel Setting Pacing Amplitude: 2 V
Lead Channel Setting Pacing Pulse Width: 0.4 ms
Lead Channel Setting Sensing Sensitivity: 2 mV

## 2020-10-18 NOTE — Progress Notes (Signed)
Remote pacemaker transmission.   

## 2020-11-04 ENCOUNTER — Ambulatory Visit: Payer: Medicare HMO | Admitting: Cardiovascular Disease

## 2020-11-04 ENCOUNTER — Encounter: Payer: Self-pay | Admitting: Cardiovascular Disease

## 2020-11-04 ENCOUNTER — Other Ambulatory Visit: Payer: Self-pay

## 2020-11-04 VITALS — BP 130/70 | HR 87 | Ht 64.5 in | Wt 220.8 lb

## 2020-11-04 DIAGNOSIS — I471 Supraventricular tachycardia: Secondary | ICD-10-CM | POA: Diagnosis not present

## 2020-11-04 DIAGNOSIS — Z950 Presence of cardiac pacemaker: Secondary | ICD-10-CM

## 2020-11-04 DIAGNOSIS — I10 Essential (primary) hypertension: Secondary | ICD-10-CM

## 2020-11-04 DIAGNOSIS — I495 Sick sinus syndrome: Secondary | ICD-10-CM

## 2020-11-04 DIAGNOSIS — Z87898 Personal history of other specified conditions: Secondary | ICD-10-CM | POA: Diagnosis not present

## 2020-11-04 NOTE — Progress Notes (Signed)
Patient ID: Tiffany Baird, female   DOB: 1945-06-08, 75 y.o.   MRN: [id number]    Cardiology Office Note    Date:  11/05/2020   ID:  Tiffany Baird, DOB March 22, 1946, MRN [id number]  PCP:  Deland Pretty, MD  Cardiologist:   Sanda Klein, MD   Chief Complaint  Patient presents with   Loss of Consciousness   Pacemaker Check    History of Present Illness:  Tiffany Baird is a 75 y.o. female with a history of sinus pause probably related to neurocardiogenic syncope, without recurrence of syncope after implantation of a dual-chamber permanent pacemaker in 2009. She does not have any known structural heart disease by previous noninvasive testing performed in 2012. She very infrequently requires pacing, confirming the absence of true sinus node dysfunction.  This is her first in person appointment since the beginning of the COVID-19 pandemic.  She has not had any serious problems during this time and has not had any syncope or falls.  Generally feels quite well.  The patient specifically denies any chest pain at rest exertion, dyspnea at rest or with exertion, orthopnea, paroxysmal nocturnal dyspnea, syncope, palpitations, focal neurological deficits, intermittent claudication, lower extremity edema, unexplained weight gain, cough, hemoptysis or wheezing.  This was the first in person pacemaker check in over 3 years.  Data has been collected since January 2019.  Her dual-chamber Medtronic Adapta pacemaker was implanted in 2009 and still has roughly 4.5 years of remaining battery longevity.  She continues to have a less than 0.1% pacing in both the atrium and ventricle.    Her pacemaker documents 169 episodes of rate drop response as well as 109 episodes of high ventricular rates.  Unfortunately, I was unable to download any of these individual events (either electrograms or trends).  The programmer reported "invalid data received from pacemaker. graph/data cannot be displayed".  I tried to  reinterrogate the device after removing the programmer.  Unfortunately in doing so, all the data was clear from the device memory.  This happened even though I did not clinically "clear data" instruction on the programmer.  Therefore I was unable to save the data to a disc either.  I notified the Medtronic pacemaker representative, but unfortunately did not have any saved data to submit for analysis.     Past Medical History:  Diagnosis Date   Complication of anesthesia    pass out after awaken from anesthesia   GERD (gastroesophageal reflux disease)    H/O cardiovascular stress test 03/27/2011   EF 86%, no ischemia   H/O echocardiogram 03/27/2011   EF >55%, mild MR, mild TR, RV systolic pressure [id number],    H/O: rheumatic fever    as a child   Head injury due to trauma 2009   fall   Headache    migraine history, last one in past few months   HTN (hypertension)    Hx of cardiac pacemaker 01/14/08   medtronic adapta   Hx of syncope    X 18 years   NSVT (nonsustained ventricular tachycardia) ([facility])    on pacer checks   PSVT (paroxysmal supraventricular tachycardia) ([place])    on pacer checks   RBBB    PSVT,    Sinus arrest 2009   6.7 sec pause with hx syncope    Past Surgical History:  Procedure Laterality Date   CHOLECYSTECTOMY     COLONOSCOPY W/ POLYPECTOMY  2012   dental implants     fractured leg  NM MYOCAR PERF WALL MOTION  03/27/11   normal   ORIF ANKLE FRACTURE Right 04/30/2016   Procedure: ORIF Right ankle;  Surgeon: Nicholes Stairs, MD;  Location: [facility];  Service: Orthopedics;  Laterality: Right;  Requests 70 mins   PACEMAKER INSERTION  01/14/2008   Medtronic    US ECHOCARDIOGRAPHY  03/27/11   mild TR, no significant valvular disease.    Current Outpatient Medications  Medication Sig Dispense Refill   Ascorbic Acid (VITAMIN C PO) Take 1 tablet by mouth daily.     calcium carbonate (OS-CAL) 600 MG TABS tablet Take 600 mg by mouth daily.      cholecalciferol (VITAMIN D) 1000 UNITS tablet Take 1,000 Units by mouth daily.     Glucos-Chond-Hyal Ac-Ca Fructo (MOVE FREE JOINT HEALTH ADVANCE) TABS Take 1 tablet by mouth 2 (two) times daily.     Multiple Vitamins-Minerals (PRESERVISION AREDS) CAPS Take 1 capsule by mouth 2 (two) times daily.      Olmesartan-amLODIPine-HCTZ 40-10-12.5 MG TABS Take by mouth in the morning.     benzonatate (TESSALON) 200 MG capsule Take 200 mg by mouth daily as needed. (Patient not taking: Reported on 11/04/2020)     Coenzyme Q10 (CO Q-10) 200 MG CAPS Take 200 mg by mouth daily. (Patient not taking: Reported on 11/04/2020)     esomeprazole (NEXIUM) 20 MG capsule Take 20 mg by mouth 2 (two) times daily. May take an additional '20mg'$  for acid reflux (Patient not taking: Reported on 11/04/2020)     No current facility-administered medications for this visit.    Allergies:   Codeine, Hydrocodone, Ash, Sumycin [tetracycline], and Tegretol [carbamazepine]   Social History   Socioeconomic History   Marital status: Married    Spouse name: Not on file   Number of children: Not on file   Years of education: Not on file   Highest education level: Not on file  Occupational History   Not on file  Tobacco Use   Smoking status: Never   Smokeless tobacco: Never  Substance and Sexual Activity   Alcohol use: Yes    Comment: one glass of wine  a month   Drug use: No   Sexual activity: Not on file  Other Topics Concern   Not on file  Social History Narrative   Not on file   Social Determinants of Health   Financial Resource Strain: Not on file  Food Insecurity: Not on file  Transportation Needs: Not on file  Physical Activity: Not on file  Stress: Not on file  Social Connections: Not on file     Family History:  The patient's family history includes Breast cancer in her mother; Cancer in her mother; Diabetes in her brother and mother; Heart disease in her father.   ROS:   Please see the history of present  illness.    Review of Systems  All other systems reviewed and are negative.   PHYSICAL EXAM:   VS:  BP 130/70 (BP Location: Left Arm, Patient Position: Sitting, Cuff Size: Large)   Pulse 87   Ht 5' 4.5" (1.638 m)   Wt 220 lb 12.8 oz (100.2 kg)   SpO2 97%   BMI 37.31 kg/m      General: Alert, oriented x3, no distress Head: no evidence of trauma, PERRL, EOMI, no exophtalmos or lid lag, no myxedema, no xanthelasma; normal ears, nose and oropharynx Neck: normal jugular venous pulsations and no hepatojugular reflux; brisk carotid pulses without delay and no carotid  bruits Chest: clear to auscultation, no signs of consolidation by percussion or palpation, normal fremitus, symmetrical and full respiratory excursions Cardiovascular: normal position and quality of the apical impulse, regular rhythm, normal first and second heart sounds, no murmurs, rubs or gallops Abdomen: no tenderness or distention, no masses by palpation, no abnormal pulsatility or arterial bruits, normal bowel sounds, no hepatosplenomegaly Extremities: no clubbing, cyanosis or edema; 2+ radial, ulnar and brachial pulses bilaterally; 2+ right femoral, posterior tibial and dorsalis pedis pulses; 2+ left femoral, posterior tibial and dorsalis pedis pulses; no subclavian or femoral bruits Neurological: grossly nonfocal Psych: Normal mood and affect Moderately obese.  Healthy left subclavian pacemaker site.  Wt Readings from Last 3 Encounters:  11/04/20 220 lb 12.8 oz (100.2 kg)  02/10/20 222 lb 12.8 oz (101.1 kg)  05/08/17 209 lb (94.8 kg)      Studies/Labs Reviewed:   EKG:  EKG is ordered today.  Personally reviewed, shows normal sinus rhythm, right bundle branch block (old) otherwise normal   ASSESSMENT:    1. History of syncope   2. PAT (paroxysmal atrial tachycardia) ([facility])   3. Hx of cardiac pacemaker   4. Essential hypertension      PLAN:  In order of problems listed above:  Vasovagal syncope: None has  occurred since pacemaker implantation.  The sudden bradycardia response is on and operates relatively frequently (roughly 50 times a year). PAT: The episodes are brief even when they are sustained.  They are asymptomatic.  No longer on a beta-blocker, but this does not appear to have had any significant impact on the arrhythmia. PM: Normally functioning device, but problems with retrieval of episodes from memory.  I wonder whether this was because of the very large batch of arrhythmic events recorded after absence of in person checks for 3 years.  Prior to today's visit, we have been able to review all the arrhythmic events on remote downloads.  Only a few recent events were lost, the ones that occurred in the month that has transpired since her last download. HTN: Well-controlled.  Even though she is taking a combination vasodilator-diuretic she has not had recurrence of syncope.   Medication Adjustments/Labs and Tests Ordered: Current medicines are reviewed at length with the patient today.  Concerns regarding medicines are outlined above.  Medication changes, Labs and Tests ordered today are listed below. Patient Instructions  Medication Instructions:  No changes *If you need a refill on your cardiac medications before your next appointment, please call your pharmacy*   Lab Work: None ordered If you have labs (blood work) drawn today and your tests are completely normal, you will receive your results only by: [place] (if you have MyChart) OR A paper copy in the mail If you have any lab test that is abnormal or we need to change your treatment, we will call you to review the results.   Testing/Procedures: None ordered   Follow-Up: At [facility], you and your health needs are our priority.  As part of our continuing mission to provide you with exceptional heart care, we have created designated Provider Care Teams.  These Care Teams include your primary Cardiologist (physician)  and Advanced Practice Providers (APPs -  Physician Assistants and Nurse Practitioners) who all work together to provide you with the care you need, when you need it.  We recommend signing up for the patient portal called "MyChart".  Sign up information is provided on this After Visit Summary.  MyChart is used to connect  with patients for Virtual Visits (Telemedicine).  Patients are able to view lab/test results, encounter notes, upcoming appointments, etc.  Non-urgent messages can be sent to your provider as well.   To learn more about what you can do with MyChart, go to [email or web address].    Your next appointment:   12 month(s)  The format for your next appointment:   In Person  Provider:   Sanda Klein, MD      Signed, Sanda Klein, MD  11/05/2020 11:10 AM    [address], [place], [place]  [address] Phone: [phone]; Fax: [phone]

## 2020-11-04 NOTE — Patient Instructions (Signed)

## 2020-11-05 ENCOUNTER — Encounter: Payer: Self-pay | Admitting: Cardiovascular Disease

## 2020-11-14 ENCOUNTER — Other Ambulatory Visit: Payer: Self-pay

## 2020-11-21 DIAGNOSIS — H25813 Combined forms of age-related cataract, bilateral: Secondary | ICD-10-CM | POA: Diagnosis not present

## 2020-11-24 DIAGNOSIS — H25812 Combined forms of age-related cataract, left eye: Secondary | ICD-10-CM | POA: Diagnosis not present

## 2020-11-24 DIAGNOSIS — I10 Essential (primary) hypertension: Secondary | ICD-10-CM | POA: Diagnosis not present

## 2020-11-24 DIAGNOSIS — I4891 Unspecified atrial fibrillation: Secondary | ICD-10-CM | POA: Diagnosis not present

## 2020-11-24 DIAGNOSIS — I495 Sick sinus syndrome: Secondary | ICD-10-CM | POA: Diagnosis not present

## 2020-11-24 DIAGNOSIS — H52202 Unspecified astigmatism, left eye: Secondary | ICD-10-CM | POA: Diagnosis not present

## 2020-11-24 DIAGNOSIS — H25813 Combined forms of age-related cataract, bilateral: Secondary | ICD-10-CM | POA: Diagnosis not present

## 2020-11-24 DIAGNOSIS — Z950 Presence of cardiac pacemaker: Secondary | ICD-10-CM | POA: Diagnosis not present

## 2020-12-08 DIAGNOSIS — H25811 Combined forms of age-related cataract, right eye: Secondary | ICD-10-CM | POA: Diagnosis not present

## 2020-12-08 DIAGNOSIS — H52201 Unspecified astigmatism, right eye: Secondary | ICD-10-CM | POA: Diagnosis not present

## 2020-12-26 ENCOUNTER — Ambulatory Visit (INDEPENDENT_AMBULATORY_CARE_PROVIDER_SITE_OTHER): Payer: Medicare HMO

## 2020-12-26 DIAGNOSIS — I495 Sick sinus syndrome: Secondary | ICD-10-CM | POA: Diagnosis not present

## 2020-12-27 LAB — CUP PACEART REMOTE DEVICE CHECK
Battery Impedance: 1710 Ohm
Battery Remaining Longevity: 53 mo
Battery Voltage: 2.76 V
Brady Statistic AP VP Percent: 0 %
Brady Statistic AP VS Percent: 0 %
Brady Statistic AS VP Percent: 0 %
Brady Statistic AS VS Percent: 100 %
Date Time Interrogation Session: 20220912081241
Implantable Lead Implant Date: 20090930
Implantable Lead Implant Date: 20090930
Implantable Lead Location: 753859
Implantable Lead Location: 753860
Implantable Lead Model: 5076
Implantable Lead Model: 5076
Implantable Pulse Generator Implant Date: 20090930
Lead Channel Impedance Value: 455 Ohm
Lead Channel Impedance Value: 491 Ohm
Lead Channel Pacing Threshold Amplitude: 0.75 V
Lead Channel Pacing Threshold Amplitude: 1 V
Lead Channel Pacing Threshold Pulse Width: 0.4 ms
Lead Channel Pacing Threshold Pulse Width: 0.4 ms
Lead Channel Setting Pacing Amplitude: 2 V
Lead Channel Setting Pacing Amplitude: 2 V
Lead Channel Setting Pacing Pulse Width: 0.4 ms
Lead Channel Setting Sensing Sensitivity: 2 mV

## 2021-01-03 NOTE — Progress Notes (Signed)
Remote pacemaker transmission.   

## 2021-01-30 DIAGNOSIS — I10 Essential (primary) hypertension: Secondary | ICD-10-CM | POA: Diagnosis not present

## 2021-01-30 DIAGNOSIS — E7801 Familial hypercholesterolemia: Secondary | ICD-10-CM | POA: Diagnosis not present

## 2021-01-30 DIAGNOSIS — K219 Gastro-esophageal reflux disease without esophagitis: Secondary | ICD-10-CM | POA: Diagnosis not present

## 2021-02-01 DIAGNOSIS — Z0000 Encounter for general adult medical examination without abnormal findings: Secondary | ICD-10-CM | POA: Diagnosis not present

## 2021-02-01 DIAGNOSIS — I10 Essential (primary) hypertension: Secondary | ICD-10-CM | POA: Diagnosis not present

## 2021-02-01 DIAGNOSIS — K219 Gastro-esophageal reflux disease without esophagitis: Secondary | ICD-10-CM | POA: Diagnosis not present

## 2021-02-01 DIAGNOSIS — M8589 Other specified disorders of bone density and structure, multiple sites: Secondary | ICD-10-CM | POA: Diagnosis not present

## 2021-02-01 DIAGNOSIS — E7800 Pure hypercholesterolemia, unspecified: Secondary | ICD-10-CM | POA: Diagnosis not present

## 2021-02-01 DIAGNOSIS — Z91030 Bee allergy status: Secondary | ICD-10-CM | POA: Diagnosis not present

## 2021-02-01 DIAGNOSIS — M79661 Pain in right lower leg: Secondary | ICD-10-CM | POA: Diagnosis not present

## 2021-02-01 DIAGNOSIS — M8580 Other specified disorders of bone density and structure, unspecified site: Secondary | ICD-10-CM | POA: Diagnosis not present

## 2021-02-02 ENCOUNTER — Other Ambulatory Visit: Payer: Self-pay | Admitting: Internal Medicine

## 2021-02-02 DIAGNOSIS — I10 Essential (primary) hypertension: Secondary | ICD-10-CM

## 2021-02-21 ENCOUNTER — Inpatient Hospital Stay: Admission: RE | Admit: 2021-02-21 | Payer: Medicare HMO | Source: Ambulatory Visit

## 2021-02-23 DIAGNOSIS — Z961 Presence of intraocular lens: Secondary | ICD-10-CM | POA: Diagnosis not present

## 2021-02-23 DIAGNOSIS — H353132 Nonexudative age-related macular degeneration, bilateral, intermediate dry stage: Secondary | ICD-10-CM | POA: Diagnosis not present

## 2021-03-21 ENCOUNTER — Other Ambulatory Visit: Payer: Self-pay | Admitting: Obstetrics & Gynecology

## 2021-03-21 DIAGNOSIS — Z1231 Encounter for screening mammogram for malignant neoplasm of breast: Secondary | ICD-10-CM

## 2021-03-30 ENCOUNTER — Ambulatory Visit (INDEPENDENT_AMBULATORY_CARE_PROVIDER_SITE_OTHER): Payer: Medicare HMO

## 2021-03-30 DIAGNOSIS — I495 Sick sinus syndrome: Secondary | ICD-10-CM | POA: Diagnosis not present

## 2021-03-31 LAB — CUP PACEART REMOTE DEVICE CHECK
Battery Impedance: 1733 Ohm
Battery Remaining Longevity: 52 mo
Battery Voltage: 2.77 V
Brady Statistic AP VP Percent: 0 %
Brady Statistic AP VS Percent: 0 %
Brady Statistic AS VP Percent: 0 %
Brady Statistic AS VS Percent: 100 %
Date Time Interrogation Session: 20221215192435
Implantable Lead Implant Date: 20090930
Implantable Lead Implant Date: 20090930
Implantable Lead Location: 753859
Implantable Lead Location: 753860
Implantable Lead Model: 5076
Implantable Lead Model: 5076
Implantable Pulse Generator Implant Date: 20090930
Lead Channel Impedance Value: 448 Ohm
Lead Channel Impedance Value: 505 Ohm
Lead Channel Pacing Threshold Amplitude: 0.875 V
Lead Channel Pacing Threshold Amplitude: 1 V
Lead Channel Pacing Threshold Pulse Width: 0.4 ms
Lead Channel Pacing Threshold Pulse Width: 0.4 ms
Lead Channel Setting Pacing Amplitude: 2 V
Lead Channel Setting Pacing Amplitude: 2 V
Lead Channel Setting Pacing Pulse Width: 0.4 ms
Lead Channel Setting Sensing Sensitivity: 2 mV

## 2021-04-11 NOTE — Progress Notes (Signed)
Remote pacemaker transmission.   

## 2021-04-25 ENCOUNTER — Ambulatory Visit
Admission: RE | Admit: 2021-04-25 | Discharge: 2021-04-25 | Disposition: A | Discharge status: Home / Self Care | Payer: Medicare HMO | Source: Ambulatory Visit | Attending: Obstetrics & Gynecology | Admitting: Obstetrics & Gynecology

## 2021-04-25 DIAGNOSIS — Z1231 Encounter for screening mammogram for malignant neoplasm of breast: Secondary | ICD-10-CM

## 2021-05-08 ENCOUNTER — Ambulatory Visit: Payer: Medicare HMO

## 2021-06-26 ENCOUNTER — Ambulatory Visit: Payer: Medicare HMO

## 2021-06-26 DIAGNOSIS — I495 Sick sinus syndrome: Secondary | ICD-10-CM | POA: Diagnosis not present

## 2021-06-27 LAB — CUP PACEART REMOTE DEVICE CHECK
Battery Impedance: 1884 Ohm
Battery Remaining Longevity: 48 mo
Battery Voltage: 2.76 V
Brady Statistic AP VP Percent: 0 %
Brady Statistic AP VS Percent: 0 %
Brady Statistic AS VP Percent: 0 %
Brady Statistic AS VS Percent: 100 %
Date Time Interrogation Session: 20230313102546
Implantable Lead Implant Date: 20090930
Implantable Lead Implant Date: 20090930
Implantable Lead Location: 753859
Implantable Lead Location: 753860
Implantable Lead Model: 5076
Implantable Lead Model: 5076
Implantable Pulse Generator Implant Date: 20090930
Lead Channel Impedance Value: 434 Ohm
Lead Channel Impedance Value: 517 Ohm
Lead Channel Pacing Threshold Amplitude: 0.875 V
Lead Channel Pacing Threshold Amplitude: 0.875 V
Lead Channel Pacing Threshold Pulse Width: 0.4 ms
Lead Channel Pacing Threshold Pulse Width: 0.4 ms
Lead Channel Setting Pacing Amplitude: 1.875
Lead Channel Setting Pacing Amplitude: 2 V
Lead Channel Setting Pacing Pulse Width: 0.4 ms
Lead Channel Setting Sensing Sensitivity: 2 mV

## 2021-07-10 NOTE — Progress Notes (Signed)
Remote pacemaker transmission.   

## 2021-07-11 DIAGNOSIS — L570 Actinic keratosis: Secondary | ICD-10-CM | POA: Diagnosis not present

## 2021-07-11 DIAGNOSIS — L821 Other seborrheic keratosis: Secondary | ICD-10-CM | POA: Diagnosis not present

## 2021-07-11 DIAGNOSIS — D225 Melanocytic nevi of trunk: Secondary | ICD-10-CM | POA: Diagnosis not present

## 2021-07-11 DIAGNOSIS — L814 Other melanin hyperpigmentation: Secondary | ICD-10-CM | POA: Diagnosis not present

## 2021-07-11 DIAGNOSIS — L820 Inflamed seborrheic keratosis: Secondary | ICD-10-CM | POA: Diagnosis not present

## 2021-07-11 DIAGNOSIS — D485 Neoplasm of uncertain behavior of skin: Secondary | ICD-10-CM | POA: Diagnosis not present

## 2021-09-25 ENCOUNTER — Ambulatory Visit (INDEPENDENT_AMBULATORY_CARE_PROVIDER_SITE_OTHER): Payer: Medicare HMO

## 2021-09-25 DIAGNOSIS — I495 Sick sinus syndrome: Secondary | ICD-10-CM

## 2021-09-27 LAB — CUP PACEART REMOTE DEVICE CHECK
Battery Impedance: 1937 Ohm
Battery Remaining Longevity: 47 mo
Battery Voltage: 2.76 V
Brady Statistic AP VP Percent: 0 %
Brady Statistic AP VS Percent: 0 %
Brady Statistic AS VP Percent: 0 %
Brady Statistic AS VS Percent: 100 %
Date Time Interrogation Session: 20230612092840
Implantable Lead Implant Date: 20090930
Implantable Lead Implant Date: 20090930
Implantable Lead Location: 753859
Implantable Lead Location: 753860
Implantable Lead Model: 5076
Implantable Lead Model: 5076
Implantable Pulse Generator Implant Date: 20090930
Lead Channel Impedance Value: 504 Ohm
Lead Channel Impedance Value: 512 Ohm
Lead Channel Pacing Threshold Amplitude: 0.875 V
Lead Channel Pacing Threshold Amplitude: 1 V
Lead Channel Pacing Threshold Pulse Width: 0.4 ms
Lead Channel Pacing Threshold Pulse Width: 0.4 ms
Lead Channel Setting Pacing Amplitude: 2 V
Lead Channel Setting Pacing Amplitude: 2 V
Lead Channel Setting Pacing Pulse Width: 0.4 ms
Lead Channel Setting Sensing Sensitivity: 2 mV

## 2021-10-10 NOTE — Progress Notes (Signed)
Remote pacemaker transmission.   

## 2021-12-14 DIAGNOSIS — H353132 Nonexudative age-related macular degeneration, bilateral, intermediate dry stage: Secondary | ICD-10-CM | POA: Diagnosis not present

## 2021-12-14 DIAGNOSIS — Z961 Presence of intraocular lens: Secondary | ICD-10-CM | POA: Diagnosis not present

## 2021-12-25 ENCOUNTER — Ambulatory Visit (INDEPENDENT_AMBULATORY_CARE_PROVIDER_SITE_OTHER): Payer: Medicare HMO

## 2021-12-25 DIAGNOSIS — I495 Sick sinus syndrome: Secondary | ICD-10-CM

## 2021-12-28 LAB — CUP PACEART REMOTE DEVICE CHECK
Battery Impedance: 1971 Ohm
Battery Remaining Longevity: 46 mo
Battery Voltage: 2.76 V
Brady Statistic AP VP Percent: 0 %
Brady Statistic AP VS Percent: 0 %
Brady Statistic AS VP Percent: 0 %
Brady Statistic AS VS Percent: 100 %
Date Time Interrogation Session: 20230911200400
Implantable Lead Implant Date: 20090930
Implantable Lead Implant Date: 20090930
Implantable Lead Location: 753859
Implantable Lead Location: 753860
Implantable Lead Model: 5076
Implantable Lead Model: 5076
Implantable Pulse Generator Implant Date: 20090930
Lead Channel Impedance Value: 421 Ohm
Lead Channel Impedance Value: 499 Ohm
Lead Channel Pacing Threshold Amplitude: 0.875 V
Lead Channel Pacing Threshold Amplitude: 1 V
Lead Channel Pacing Threshold Pulse Width: 0.4 ms
Lead Channel Pacing Threshold Pulse Width: 0.4 ms
Lead Channel Setting Pacing Amplitude: 2 V
Lead Channel Setting Pacing Amplitude: 2 V
Lead Channel Setting Pacing Pulse Width: 0.4 ms
Lead Channel Setting Sensing Sensitivity: 2 mV

## 2022-01-10 DIAGNOSIS — Z86010 Personal history of colonic polyps: Secondary | ICD-10-CM | POA: Diagnosis not present

## 2022-01-11 NOTE — Progress Notes (Signed)
Remote pacemaker transmission.   

## 2022-01-26 DIAGNOSIS — H5203 Hypermetropia, bilateral: Secondary | ICD-10-CM | POA: Diagnosis not present

## 2022-01-26 DIAGNOSIS — H524 Presbyopia: Secondary | ICD-10-CM | POA: Diagnosis not present

## 2022-01-26 DIAGNOSIS — H52203 Unspecified astigmatism, bilateral: Secondary | ICD-10-CM | POA: Diagnosis not present

## 2022-02-02 DIAGNOSIS — M810 Age-related osteoporosis without current pathological fracture: Secondary | ICD-10-CM | POA: Diagnosis not present

## 2022-02-02 DIAGNOSIS — I10 Essential (primary) hypertension: Secondary | ICD-10-CM | POA: Diagnosis not present

## 2022-02-02 DIAGNOSIS — E7800 Pure hypercholesterolemia, unspecified: Secondary | ICD-10-CM | POA: Diagnosis not present

## 2022-02-05 DIAGNOSIS — Z23 Encounter for immunization: Secondary | ICD-10-CM | POA: Diagnosis not present

## 2022-02-05 DIAGNOSIS — I10 Essential (primary) hypertension: Secondary | ICD-10-CM | POA: Diagnosis not present

## 2022-02-05 DIAGNOSIS — M810 Age-related osteoporosis without current pathological fracture: Secondary | ICD-10-CM | POA: Diagnosis not present

## 2022-02-05 DIAGNOSIS — Z950 Presence of cardiac pacemaker: Secondary | ICD-10-CM | POA: Diagnosis not present

## 2022-02-05 DIAGNOSIS — K219 Gastro-esophageal reflux disease without esophagitis: Secondary | ICD-10-CM | POA: Diagnosis not present

## 2022-02-05 DIAGNOSIS — Z0000 Encounter for general adult medical examination without abnormal findings: Secondary | ICD-10-CM | POA: Diagnosis not present

## 2022-02-05 DIAGNOSIS — Z1211 Encounter for screening for malignant neoplasm of colon: Secondary | ICD-10-CM | POA: Diagnosis not present

## 2022-03-12 ENCOUNTER — Encounter: Payer: Self-pay | Admitting: Cardiovascular Disease

## 2022-03-12 ENCOUNTER — Ambulatory Visit: Payer: Medicare HMO | Attending: Cardiovascular Disease | Admitting: Cardiovascular Disease

## 2022-03-12 VITALS — BP 124/74 | HR 85 | Ht 64.5 in | Wt 218.6 lb

## 2022-03-12 DIAGNOSIS — Z87898 Personal history of other specified conditions: Secondary | ICD-10-CM

## 2022-03-12 DIAGNOSIS — I4510 Unspecified right bundle-branch block: Secondary | ICD-10-CM | POA: Diagnosis not present

## 2022-03-12 DIAGNOSIS — R55 Syncope and collapse: Secondary | ICD-10-CM | POA: Diagnosis not present

## 2022-03-12 DIAGNOSIS — I10 Essential (primary) hypertension: Secondary | ICD-10-CM

## 2022-03-12 DIAGNOSIS — Z950 Presence of cardiac pacemaker: Secondary | ICD-10-CM

## 2022-03-12 DIAGNOSIS — I4719 Other supraventricular tachycardia: Secondary | ICD-10-CM | POA: Diagnosis not present

## 2022-03-12 NOTE — Patient Instructions (Signed)
Medication Instructions:  Your physician recommends that you continue on your current medications as directed. Please refer to the Current Medication list given to you today.  *If you need a refill on your cardiac medications before your next appointment, please call your pharmacy*   Follow-Up: At [facility], you and your health needs are our priority.  As part of our continuing mission to provide you with exceptional heart care, we have created designated Provider Care Teams.  These Care Teams include your primary Cardiologist (physician) and Advanced Practice Providers (APPs -  Physician Assistants and Nurse Practitioners) who all work together to provide you with the care you need, when you need it.  We recommend signing up for the patient portal called "MyChart".  Sign up information is provided on this After Visit Summary.  MyChart is used to connect with patients for Virtual Visits (Telemedicine).  Patients are able to view lab/test results, encounter notes, upcoming appointments, etc.  Non-urgent messages can be sent to your provider as well.   To learn more about what you can do with MyChart, go to [email or web address].    Your next appointment:   12 month(s)  The format for your next appointment:   In Person  Provider:   Mihai Croitoru, MD 

## 2022-03-12 NOTE — Progress Notes (Unsigned)
Patient ID: Tiffany Baird, female   DOB: 1945-09-07, 76 y.o.   MRN: [id number]    Cardiology Office Note    Date:  03/13/2022   ID:  Tiffany Baird, DOB July 29, 1945, MRN [id number]  PCP:  Deland Pretty, MD  Cardiologist:   Sanda Klein, MD   Chief Complaint  Patient presents with   Pacemaker Check    History of Present Illness:  Tiffany Baird is a 76 y.o. female with a history of sinus pause probably related to neurocardiogenic syncope, without recurrence of syncope after implantation of a dual-chamber permanent pacemaker in 2009. She does not have any known structural heart disease by previous noninvasive testing performed in 2012. She very infrequently requires pacing, confirming the absence of true sinus node dysfunction.  She feels great and has not had any new syncopal events. The patient specifically denies any chest pain at rest exertion, dyspnea at rest or with exertion, orthopnea, paroxysmal nocturnal dyspnea, syncope, palpitations, focal neurological deficits, intermittent claudication, lower extremity edema, unexplained weight gain, cough, hemoptysis or wheezing.  Her dual-chamber Medtronic Adapta pacemaker was implanted in 2009 and still has over 2 years of remaining battery longevity.  She continues to have a less than 0.1% pacing in both the atrium and ventricle.  The device has recorded several episodes of rate drop response and a handful of episodes of high ventricular rate (electrogram review is consistent with paroxysmal atrial tachycardia with 1: 1 AV conduction).  Most of the episodes are nonsustained consisting of 10-12 beats, there was only 1 episode over 1 minute recorded (cannot exclude that this was sinus tachycardia, since the electrogram is not available).     Past Medical History:  Diagnosis Date   Complication of anesthesia    pass out after awaken from anesthesia   GERD (gastroesophageal reflux disease)    H/O cardiovascular stress test 03/27/2011   EF  86%, no ischemia   H/O echocardiogram 03/27/2011   EF >55%, mild MR, mild TR, RV systolic pressure [id number],    H/O: rheumatic fever    as a child   Head injury due to trauma 2009   fall   Headache    migraine history, last one in past few months   HTN (hypertension)    Hx of cardiac pacemaker 01/14/08   medtronic adapta   Hx of syncope    X 18 years   NSVT (nonsustained ventricular tachycardia) ([facility])    on pacer checks   PSVT (paroxysmal supraventricular tachycardia)    on pacer checks   RBBB    PSVT,    Sinus arrest 2009   6.7 sec pause with hx syncope    Past Surgical History:  Procedure Laterality Date   CHOLECYSTECTOMY     COLONOSCOPY W/ POLYPECTOMY  2012   dental implants     fractured leg     NM MYOCAR PERF WALL MOTION  03/27/11   normal   ORIF ANKLE FRACTURE Right 04/30/2016   Procedure: ORIF Right ankle;  Surgeon: Nicholes Stairs, MD;  Location: [facility];  Service: Orthopedics;  Laterality: Right;  Requests 70 mins   PACEMAKER INSERTION  01/14/2008   Medtronic    US ECHOCARDIOGRAPHY  03/27/11   mild TR, no significant valvular disease.    Current Outpatient Medications  Medication Sig Dispense Refill   Ascorbic Acid (VITAMIN C PO) Take 1 tablet by mouth daily.     ascorbic acid (VITAMIN C) 500 MG tablet Take 500 mg by mouth daily.  benzonatate (TESSALON) 200 MG capsule Take 200 mg by mouth daily as needed.     calcium carbonate (OS-CAL) 600 MG TABS tablet Take 600 mg by mouth daily.     cholecalciferol (VITAMIN D) 1000 UNITS tablet Take 2,000 Units by mouth daily.     esomeprazole (NEXIUM) 20 MG capsule Take 20 mg by mouth 2 (two) times daily. May take an additional '20mg'$  for acid reflux     Glucos-Chond-Hyal Ac-Ca Fructo (MOVE FREE JOINT HEALTH ADVANCE) TABS Take 1 tablet by mouth 2 (two) times daily.     Multiple Vitamins-Minerals (PRESERVISION AREDS) CAPS Take 1 capsule by mouth 2 (two) times daily.      Naproxen Sodium (ALEVE PO) Take 1 capsule by  mouth 2 (two) times daily.     OVER THE COUNTER MEDICATION Take 1 tablet by mouth 2 (two) times daily.     OVER THE COUNTER MEDICATION Take 1 capsule by mouth in the morning and at bedtime.     OVER THE COUNTER MEDICATION Take 1 tablet by mouth daily in the afternoon.     Coenzyme Q10 (CO Q-10) 200 MG CAPS Take 200 mg by mouth daily. (Patient not taking: Reported on 11/04/2020)     EPINEPHrine (EPIPEN 2-PAK) 0.3 mg/0.3 mL IJ SOAJ injection Inject 0.3 mg into the muscle as needed. (Patient not taking: Reported on 03/12/2022)     Olmesartan-amLODIPine-HCTZ 40-10-12.5 MG TABS Take by mouth in the morning. (Patient not taking: Reported on 03/12/2022)     No current facility-administered medications for this visit.    Allergies:   Codeine, Hydrocodone, Ash, Sumycin [tetracycline], and Tegretol [carbamazepine]   Social History   Socioeconomic History   Marital status: Married    Spouse name: Not on file   Number of children: Not on file   Years of education: Not on file   Highest education level: Not on file  Occupational History   Not on file  Tobacco Use   Smoking status: Never   Smokeless tobacco: Never  Substance and Sexual Activity   Alcohol use: Yes    Comment: one glass of wine  a month   Drug use: No   Sexual activity: Not on file  Other Topics Concern   Not on file  Social History Narrative   Not on file   Social Determinants of Health   Financial Resource Strain: Not on file  Food Insecurity: Not on file  Transportation Needs: Not on file  Physical Activity: Not on file  Stress: Not on file  Social Connections: Not on file     Family History:  The patient's family history includes Breast cancer in her mother; Cancer in her mother; Diabetes in her brother and mother; Heart disease in her father.   ROS:   Please see the history of present illness.    Review of Systems  All other systems reviewed and are negative.    PHYSICAL EXAM:   VS:  BP 124/74 (BP  Location: Left Arm, Patient Position: Sitting, Cuff Size: Large)   Pulse 85   Ht 5' 4.5" (1.638 m)   Wt 99.2 kg   SpO2 99%   BMI 36.94 kg/m      General: Alert, oriented x3, no distress, moderate to severely obese.  Healthy left subclavian pacemaker site. Head: no evidence of trauma, PERRL, EOMI, no exophtalmos or lid lag, no myxedema, no xanthelasma; normal ears, nose and oropharynx Neck: normal jugular venous pulsations and no hepatojugular reflux; brisk carotid pulses without delay  and no carotid bruits Chest: clear to auscultation, no signs of consolidation by percussion or palpation, normal fremitus, symmetrical and full respiratory excursions Cardiovascular: normal position and quality of the apical impulse, regular rhythm, normal first and second heart sounds, no murmurs, rubs or gallops Abdomen: no tenderness or distention, no masses by palpation, no abnormal pulsatility or arterial bruits, normal bowel sounds, no hepatosplenomegaly Extremities: no clubbing, cyanosis or edema; 2+ radial, ulnar and brachial pulses bilaterally; 2+ right femoral, posterior tibial and dorsalis pedis pulses; 2+ left femoral, posterior tibial and dorsalis pedis pulses; no subclavian or femoral bruits Neurological: grossly nonfocal Psych: Normal mood and affect   Wt Readings from Last 3 Encounters:  03/12/22 99.2 kg  11/04/20 100.2 kg  02/10/20 101.1 kg      Studies/Labs Reviewed:   EKG:  EKG is ordered today.  It shows normal sinus rhythm and old right bundle branch block, otherwise normal.   ASSESSMENT:    1. Vasovagal syncope   2. PAT (paroxysmal atrial tachycardia)   3. Hx of cardiac pacemaker   4. Essential hypertension   5. RBBB      PLAN:  In order of problems listed above:  Vasovagal syncope: Symptom-free since pacemaker implantation.  The device continues to show several episode of rate drop intervention. PAT: The episodes are brief, mostly nonsustained, asymptomatic.   Treatment does not appear to be indicated. PM: Normal device function.  Very low need for pacing.  Continue remote downloads every 3 months.  She has Medtronic 5076 atrial and ventricular leads when the time comes for generator change out she will be able to have an MRI conditional system. HTN: Excellent control.  Congratulated her on some weight loss.  Requiring fewer antihypertensive medications now. RBBB: No clinical or echo evidence of structural heart disease.  She does not have hypersomnolence or other symptoms of obstructive sleep apnea.   Medication Adjustments/Labs and Tests Ordered: Current medicines are reviewed at length with the patient today.  Concerns regarding medicines are outlined above.  Medication changes, Labs and Tests ordered today are listed below. Patient Instructions  Medication Instructions:  Your physician recommends that you continue on your current medications as directed. Please refer to the Current Medication list given to you today.  *If you need a refill on your cardiac medications before your next appointment, please call your pharmacy*  Follow-Up: At [facility], you and your health needs are our priority.  As part of our continuing mission to provide you with exceptional heart care, we have created designated Provider Care Teams.  These Care Teams include your primary Cardiologist (physician) and Advanced Practice Providers (APPs -  Physician Assistants and Nurse Practitioners) who all work together to provide you with the care you need, when you need it.  We recommend signing up for the patient portal called "MyChart".  Sign up information is provided on this After Visit Summary.  MyChart is used to connect with patients for Virtual Visits (Telemedicine).  Patients are able to view lab/test results, encounter notes, upcoming appointments, etc.  Non-urgent messages can be sent to your provider as well.   To learn more about what you can do with  MyChart, go to [email or web address].    Your next appointment:   12 month(s)  The format for your next appointment:   In Person  Provider:   Sanda Klein, MD           Signed, Sanda Klein, MD  03/13/2022 9:27 AM    [facility] [address], [place], [place]  [address] Phone: [phone]; Fax: [phone]

## 2022-03-19 ENCOUNTER — Encounter: Payer: Self-pay | Admitting: Cardiovascular Disease

## 2022-03-22 DIAGNOSIS — Z0100 Encounter for examination of eyes and vision without abnormal findings: Secondary | ICD-10-CM | POA: Diagnosis not present

## 2022-03-26 ENCOUNTER — Ambulatory Visit (INDEPENDENT_AMBULATORY_CARE_PROVIDER_SITE_OTHER): Payer: Medicare HMO

## 2022-03-26 DIAGNOSIS — I495 Sick sinus syndrome: Secondary | ICD-10-CM

## 2022-03-27 LAB — CUP PACEART REMOTE DEVICE CHECK
Battery Impedance: 2071 Ohm
Battery Remaining Longevity: 44 mo
Battery Voltage: 2.76 V
Brady Statistic AP VP Percent: 0 %
Brady Statistic AP VS Percent: 0 %
Brady Statistic AS VP Percent: 0 %
Brady Statistic AS VS Percent: 100 %
Date Time Interrogation Session: 20231211103700
Implantable Lead Connection Status: 753985
Implantable Lead Connection Status: 753985
Implantable Lead Implant Date: 20090930
Implantable Lead Implant Date: 20090930
Implantable Lead Location: 753859
Implantable Lead Location: 753860
Implantable Lead Model: 5076
Implantable Lead Model: 5076
Implantable Pulse Generator Implant Date: 20090930
Lead Channel Impedance Value: 528 Ohm
Lead Channel Impedance Value: 562 Ohm
Lead Channel Pacing Threshold Amplitude: 0.875 V
Lead Channel Pacing Threshold Amplitude: 1 V
Lead Channel Pacing Threshold Pulse Width: 0.4 ms
Lead Channel Pacing Threshold Pulse Width: 0.4 ms
Lead Channel Setting Pacing Amplitude: 2 V
Lead Channel Setting Pacing Amplitude: 2 V
Lead Channel Setting Pacing Pulse Width: 0.4 ms
Lead Channel Setting Sensing Sensitivity: 2 mV
Zone Setting Status: 755011
Zone Setting Status: 755011

## 2022-03-29 ENCOUNTER — Other Ambulatory Visit: Payer: Self-pay | Admitting: Internal Medicine

## 2022-03-29 DIAGNOSIS — Z01419 Encounter for gynecological examination (general) (routine) without abnormal findings: Secondary | ICD-10-CM | POA: Diagnosis not present

## 2022-03-29 DIAGNOSIS — Z1231 Encounter for screening mammogram for malignant neoplasm of breast: Secondary | ICD-10-CM

## 2022-03-29 DIAGNOSIS — Z6836 Body mass index (BMI) 36.0-36.9, adult: Secondary | ICD-10-CM | POA: Diagnosis not present

## 2022-05-04 NOTE — Progress Notes (Signed)
Remote pacemaker transmission.   

## 2022-05-16 DIAGNOSIS — Z1211 Encounter for screening for malignant neoplasm of colon: Secondary | ICD-10-CM | POA: Diagnosis not present

## 2022-05-16 DIAGNOSIS — K648 Other hemorrhoids: Secondary | ICD-10-CM | POA: Diagnosis not present

## 2022-05-16 DIAGNOSIS — K635 Polyp of colon: Secondary | ICD-10-CM | POA: Diagnosis not present

## 2022-05-16 DIAGNOSIS — Z950 Presence of cardiac pacemaker: Secondary | ICD-10-CM | POA: Diagnosis not present

## 2022-05-16 DIAGNOSIS — D122 Benign neoplasm of ascending colon: Secondary | ICD-10-CM | POA: Diagnosis not present

## 2022-05-21 ENCOUNTER — Ambulatory Visit
Admission: RE | Admit: 2022-05-21 | Discharge: 2022-05-21 | Disposition: A | Discharge status: Home / Self Care | Payer: Medicare HMO | Source: Ambulatory Visit | Attending: Internal Medicine | Admitting: Internal Medicine

## 2022-05-21 DIAGNOSIS — Z1231 Encounter for screening mammogram for malignant neoplasm of breast: Secondary | ICD-10-CM | POA: Diagnosis not present

## 2022-05-22 ENCOUNTER — Ambulatory Visit: Payer: Medicare HMO

## 2022-06-15 ENCOUNTER — Emergency Department (HOSPITAL_COMMUNITY)
Admission: EM | Admit: 2022-06-15 | Discharge: 2022-06-15 | Disposition: A | Discharge status: Home / Self Care | Payer: Medicare HMO | Attending: Emergency Medicine | Admitting: Emergency Medicine

## 2022-06-15 ENCOUNTER — Emergency Department (HOSPITAL_COMMUNITY): Payer: Medicare HMO

## 2022-06-15 ENCOUNTER — Other Ambulatory Visit: Payer: Self-pay

## 2022-06-15 ENCOUNTER — Encounter (HOSPITAL_COMMUNITY): Payer: Self-pay | Admitting: Emergency Medicine

## 2022-06-15 ENCOUNTER — Ambulatory Visit
Admission: EM | Admit: 2022-06-15 | Discharge: 2022-06-15 | Disposition: A | Discharge status: Other Acute Inpatient Hospital | Payer: Medicare HMO | Attending: Family Medicine | Admitting: Family Medicine

## 2022-06-15 DIAGNOSIS — N200 Calculus of kidney: Secondary | ICD-10-CM

## 2022-06-15 DIAGNOSIS — R109 Unspecified abdominal pain: Secondary | ICD-10-CM | POA: Diagnosis not present

## 2022-06-15 DIAGNOSIS — N201 Calculus of ureter: Secondary | ICD-10-CM | POA: Diagnosis not present

## 2022-06-15 DIAGNOSIS — N23 Unspecified renal colic: Secondary | ICD-10-CM | POA: Diagnosis not present

## 2022-06-15 DIAGNOSIS — N132 Hydronephrosis with renal and ureteral calculous obstruction: Secondary | ICD-10-CM | POA: Diagnosis not present

## 2022-06-15 LAB — POCT URINALYSIS DIP (MANUAL ENTRY)
Bilirubin, UA: NEGATIVE
Glucose, UA: NEGATIVE mg/dL
Leukocytes, UA: NEGATIVE
Nitrite, UA: NEGATIVE
Protein Ur, POC: 30 mg/dL — AB
Spec Grav, UA: >=1.03 — AB (ref 1.010–1.025)
Urobilinogen, UA: 0.2 E.U./dL
pH, UA: 6 (ref 5.0–8.0)

## 2022-06-15 MED ORDER — TAMSULOSIN HCL 0.4 MG PO CAPS: 0.4000 mg | ORAL_CAPSULE | Freq: Every day | ORAL | 0 refills | Status: AC

## 2022-06-15 MED ORDER — ONDANSETRON 4 MG PO TBDP: 4.0000 mg | ORAL_TABLET | Freq: Three times a day (TID) | ORAL | 0 refills | Status: DC | PRN

## 2022-06-15 MED ORDER — OXYCODONE HCL 5 MG PO TABS: 5.0000 mg | ORAL_TABLET | Freq: Four times a day (QID) | ORAL | 0 refills | Status: DC | PRN

## 2022-06-15 MED ORDER — KETOROLAC TROMETHAMINE 30 MG/ML IJ SOLN
30.0000 mg | Freq: Once | INTRAMUSCULAR | Status: AC
  Administered 2022-06-15: 30 mg via INTRAMUSCULAR

## 2022-06-15 NOTE — Discharge Instructions (Addendum)
You are passing a 4 mm stone from your left kidney, which has almost reached your bladder.  Please continue drinking plenty of water at home.  Reviewed the instructions attached about kidney stones or renal colic.

## 2022-06-15 NOTE — ED Provider Notes (Signed)
[facility] Provider Note   CSN: [id number] Arrival date & time: 06/15/22  2037     History  Chief Complaint  Patient presents with   Flank Pain   Emesis    Tiffany Baird is a 77 y.o. female presented to ED with left-sided flank pain.  Onset earlier this afternoon.  Reports difficulty with urination early this morning.  No history of kidney stones.  He went to urgent care and was given a shot of pain medicine in the arm and sent here for further evaluation.  Currently she is asymptomatic.  HPI     Home Medications Prior to Admission medications   Medication Sig Start Date End Date Taking? Authorizing Provider  ondansetron (ZOFRAN-ODT) 4 MG disintegrating tablet Take 1 tablet (4 mg total) by mouth every 8 (eight) hours as needed for up to 10 doses for nausea or vomiting. 06/15/22  Yes Laquanta Hummel, Carola Rhine, MD  oxyCODONE (ROXICODONE) 5 MG immediate release tablet Take 1 tablet (5 mg total) by mouth every 6 (six) hours as needed for up to 5 doses for severe pain. 06/15/22  Yes Christapher Gillian, Carola Rhine, MD  tamsulosin (FLOMAX) 0.4 MG CAPS capsule Take 1 capsule (0.4 mg total) by mouth daily for 10 days. 06/15/22 06/25/22 Yes Faizon Capozzi, Carola Rhine, MD  ascorbic acid (VITAMIN C) 500 MG tablet Take 500 mg by mouth daily.    [provider]  calcium carbonate (OS-CAL) 600 MG TABS tablet Take 600 mg by mouth daily.    [provider]  cholecalciferol (VITAMIN D) 1000 UNITS tablet Take 2,000 Units by mouth daily.    [provider]  EPINEPHrine (EPIPEN 2-PAK) 0.3 mg/0.3 mL IJ SOAJ injection Inject 0.3 mg into the muscle as needed. Patient not taking: Reported on 03/12/2022 02/01/21   [provider]  esomeprazole (NEXIUM) 20 MG capsule Take 20 mg by mouth 2 (two) times daily. May take an additional '20mg'$  for acid reflux    [provider]  Glucos-Chond-Hyal Ac-Ca Fructo ([name]) TABS Take 1 tablet by  mouth 2 (two) times daily.    [provider]  Multiple Vitamins-Minerals (PRESERVISION AREDS) CAPS Take 1 capsule by mouth 2 (two) times daily.     [provider]  Naproxen Sodium (ALEVE PO) Take 1 capsule by mouth 2 (two) times daily.    [provider]  Olmesartan-amLODIPine-HCTZ 40-10-12.5 MG TABS Take by mouth in the morning. Patient not taking: Reported on 03/12/2022 06/28/20   [provider]  OVER THE COUNTER MEDICATION Take 1 tablet by mouth 2 (two) times daily.    [provider]  OVER THE COUNTER MEDICATION Take 1 capsule by mouth in the morning and at bedtime.    [provider]  OVER THE COUNTER MEDICATION Take 1 tablet by mouth daily in the afternoon.    [provider]      Allergies    Codeine, Hydrocodone, Ash, Sumycin [tetracycline], and Tegretol [carbamazepine]    Review of Systems   Review of Systems  Physical Exam Updated Vital Signs BP (!) 142/114 (BP Location: Left Arm)   Pulse (!) 110   Temp 98.4 F (36.9 C) (Oral)   Resp 18   Ht '5\' 5"'$  (1.651 m)   Wt 99.8 kg   SpO2 93%   BMI 36.61 kg/m  Physical Exam Constitutional:      General: She is not in acute distress. HENT:     Head:  Normocephalic and atraumatic.  Eyes:     Conjunctiva/sclera: Conjunctivae normal.     Pupils: Pupils are equal, round, and reactive to light.  Cardiovascular:     Rate and Rhythm: Normal rate and regular rhythm.  Pulmonary:     Effort: Pulmonary effort is normal. No respiratory distress.  Abdominal:     General: There is no distension.     Tenderness: There is no abdominal tenderness.  Skin:    General: Skin is warm and dry.  Neurological:     General: No focal deficit present.     Mental Status: She is alert. Mental status is at baseline.  Psychiatric:        Mood and Affect: Mood normal.        Behavior: Behavior normal.     ED Results / Procedures / Treatments   Labs (all labs ordered are listed, but  only abnormal results are displayed) Labs Reviewed - No data to display  EKG None  Radiology CT Renal Stone Study  Result Date: 06/15/2022 CLINICAL DATA:  Left lower abdominal pain, nausea/vomiting EXAM: CT ABDOMEN AND PELVIS WITHOUT CONTRAST TECHNIQUE: Multidetector CT imaging of the abdomen and pelvis was performed following the standard protocol without IV contrast. RADIATION DOSE REDUCTION: This exam was performed according to the departmental dose-optimization program which includes automated exposure control, adjustment of the mA and/or kV according to patient size and/or use of iterative reconstruction technique. COMPARISON:  None Available. FINDINGS: Lower chest: Lung bases are clear. Hepatobiliary: Unenhanced liver is unremarkable. Status post cholecystectomy. No intrahepatic or extrahepatic duct dilatation. Pancreas: Within normal limits. Spleen: Within normal limits. Adrenals/Urinary Tract: Adrenal glands are within normal limits. 4 mm nonobstructing posterior interpolar right renal calculus (series 2/image 35), without hydronephrosis. Punctate nonobstructing left lower pole renal calculus (series 2/image 42). Left perirenal edema/perinephric stranding with mild left hydroureteronephrosis. Associated 4 mm distal left ureteral calculus at the UVJ (series 2/image 81). Bladder is within normal limits. Stomach/Bowel: Stomach is within normal limits. No evidence of bowel obstruction. Appendix is not discretely visualized. No colonic wall thickening or inflammatory change. Vascular/Lymphatic: No evidence of abdominal aortic aneurysm. Atherosclerotic calcifications of the abdominal aorta and branch vessels. No suspicious abdominopelvic lymphadenopathy. Reproductive: Uterus is within normal limits. Bilateral ovaries are within normal limits. Other: No abdominopelvic ascites. Musculoskeletal: Mild degenerative changes of the visualized thoracolumbar spine. IMPRESSION: 4 mm distal left ureteral calculus at  the UVJ. Associated mild left hydroureteronephrosis. Additional bilateral nonobstructing renal calculi, measuring up to 4 mm on the right. Electronically Signed   By: Julian Hy M.D.   On: 06/15/2022 21:45    Procedures Procedures    Medications Ordered in ED Medications - No data to display  ED Course/ Medical Decision Making/ A&P                             Medical Decision Making Amount and/or Complexity of Data Reviewed Radiology: ordered.  Risk Prescription drug management.   Differential includes utero colic versus UTI versus other  Lower colicky abdominal pain and difficulty with urination.  I reviewed external records including urgent care workup and UA, which showed hemoglobin but no evidence of infection or leukocytes or nitrites.  CT scan renal study was ordered here showing a distal 4 mm left-sided stone is likely cause of her symptoms.  Mild hydronephrosis.  Her symptoms otherwise better controlled.  She is somewhat tachycardic and hypertensive likely related to pain.  Pain  medication was given.  We will try a trial of passage.  No evidence of pyelonephritis or sepsis at this time.  She is stable for discharge.         Final Clinical Impression(s) / ED Diagnoses Final diagnoses:  Ureteral colic  Kidney stone    Rx / DC Orders ED Discharge Orders          Ordered    oxyCODONE (ROXICODONE) 5 MG immediate release tablet  Every 6 hours PRN        06/15/22 2223    tamsulosin (FLOMAX) 0.4 MG CAPS capsule  Daily        06/15/22 2223    ondansetron (ZOFRAN-ODT) 4 MG disintegrating tablet  Every 8 hours PRN        06/15/22 2223              Wyvonnia Dusky, MD 06/15/22 2224

## 2022-06-15 NOTE — Discharge Instructions (Signed)
You have been given a shot of Toradol 30 mg today.  Proceed to the emergency room for further evaluation and a higher level of evaluation and treatment then we can provide here in the urgent care setting

## 2022-06-15 NOTE — ED Provider Notes (Signed)
[facility]    CSN: [id number] Arrival date & time: 06/15/22  1914      History   Chief Complaint Chief Complaint  Patient presents with   Nausea    HPI Tiffany Baird is a 77 y.o. female.   HPI Here for nausea and vomiting that began at 315 this afternoon, over 4 hours ago.  It began suddenly and she proceeded to vomit about 8 times in a row.  She has not thrown up since about 3 hours ago.  When she awoke she had trouble urinating and has not been able to urinate all day.  She was able to produce a very small urine specimen this morning.  No fever or chills or dysuria noted.  She has never been known to have a renal stone previously.  Last bowel movement was yesterday.  Past Medical History:  Diagnosis Date   Complication of anesthesia    pass out after awaken from anesthesia   GERD (gastroesophageal reflux disease)    H/O cardiovascular stress test 03/27/2011   EF 86%, no ischemia   H/O echocardiogram 03/27/2011   EF >55%, mild MR, mild TR, RV systolic pressure 99991111,    H/O: rheumatic fever    as a child   Head injury due to trauma 2009   fall   Headache    migraine history, last one in past few months   HTN (hypertension)    Hx of cardiac pacemaker 01/14/08   medtronic adapta   Hx of syncope    X 18 years   NSVT (nonsustained ventricular tachycardia) ([place])    on pacer checks   PSVT (paroxysmal supraventricular tachycardia)    on pacer checks   RBBB    PSVT,    Sinus arrest 2009   6.7 sec pause with hx syncope    Patient Active Problem List   Diagnosis Date Noted   Bimalleolar ankle fracture, right, closed, initial encounter 04/30/2016   Neurocardiogenic syncope 04/08/2013   Syncope 02/19/2013   NSVT (nonsustained ventricular tachycardia) (H[facility])    PSVT (paroxysmal supraventricular tachycardia)    HTN (hypertension)    Hx of cardiac pacemaker 01/14/2008    Past Surgical History:  Procedure Laterality Date   CHOLECYSTECTOMY      COLONOSCOPY W/ POLYPECTOMY  2012   dental implants     fractured leg     NM MYOCAR PERF WALL MOTION  03/27/11   normal   ORIF ANKLE FRACTURE Right 04/30/2016   Procedure: ORIF Right ankle;  Surgeon: Nicholes Stairs, MD;  Location: [place];  Service: Orthopedics;  Laterality: Right;  Requests 70 mins   PACEMAKER INSERTION  01/14/2008   Medtronic    US ECHOCARDIOGRAPHY  03/27/11   mild TR, no significant valvular disease.    OB History   No obstetric history on file.      Home Medications    Prior to Admission medications   Medication Sig Start Date End Date Taking? Authorizing Provider  ascorbic acid (VITAMIN C) 500 MG tablet Take 500 mg by mouth daily.    [provider]  calcium carbonate (OS-CAL) 600 MG TABS tablet Take 600 mg by mouth daily.    [provider]  cholecalciferol (VITAMIN D) 1000 UNITS tablet Take 2,000 Units by mouth daily.    [provider]  EPINEPHrine (EPIPEN 2-PAK) 0.3 mg/0.3 mL IJ SOAJ injection Inject 0.3 mg into the muscle as needed. Patient not taking: Reported on 03/12/2022 02/01/21   [provider]  esomeprazole (NEXIUM) 20 MG capsule Take 20 mg by mouth 2 (two) times daily. May take an additional '20mg'$  for acid reflux    [provider]  Glucos-Chond-Hyal Ac-Ca Fructo ([place]) TABS Take 1 tablet by mouth 2 (two) times daily.    [provider]  Multiple Vitamins-Minerals (PRESERVISION AREDS) CAPS Take 1 capsule by mouth 2 (two) times daily.     [provider]  Naproxen Sodium (ALEVE PO) Take 1 capsule by mouth 2 (two) times daily.    [provider]  Olmesartan-amLODIPine-HCTZ 40-10-12.5 MG TABS Take by mouth in the morning. Patient not taking: Reported on 03/12/2022 06/28/20   [provider]  OVER THE COUNTER MEDICATION Take 1 tablet by mouth 2 (two) times daily.    [provider]  OVER THE COUNTER MEDICATION Take 1 capsule by mouth in  the morning and at bedtime.    [provider]  OVER THE COUNTER MEDICATION Take 1 tablet by mouth daily in the afternoon.    [provider]    Family History Family History  Problem Relation Age of Onset   Cancer Mother    Diabetes Mother    Breast cancer Mother    Heart disease Father    Diabetes Brother     Social History Social History   Tobacco Use   Smoking status: Never   Smokeless tobacco: Never  Vaping Use   Vaping Use: Never used  Substance Use Topics   Alcohol use: Yes    Comment: one glass of wine  a month   Drug use: No     Allergies   Codeine, Hydrocodone, Ash, Sumycin [tetracycline], and Tegretol [carbamazepine]   Review of Systems Review of Systems   Physical Exam Triage Vital Signs ED Triage Vitals  Enc Vitals Group     BP 06/15/22 1941 133/83     Pulse Rate 06/15/22 1941 96     Resp 06/15/22 1941 16     Temp 06/15/22 1941 97.9 F (36.6 C)     Temp Source 06/15/22 1941 Oral     SpO2 06/15/22 1941 96 %     Weight --      Height --      Head Circumference --      Peak Flow --      Pain Score 06/15/22 1942 5     Pain Loc --      Pain Edu? --      Excl. in [place]? --    No data found.  Updated Vital Signs BP 133/83 (BP Location: Left Arm)   Pulse 96   Temp 97.9 F (36.6 C) (Oral)   Resp 16   SpO2 96%   Visual Acuity Right Eye Distance:   Left Eye Distance:   Bilateral Distance:    Right Eye Near:   Left Eye Near:    Bilateral Near:     Physical Exam Vitals reviewed.  Constitutional:      Appearance: She is not ill-appearing, toxic-appearing or diaphoretic.     Comments: Patient is alert and does answer my questions, but she is a little slow to respond to each question.  Vital signs are actually reassuring.  HENT:     Mouth/Throat:     Mouth: Mucous membranes are moist.  Eyes:     Extraocular Movements: Extraocular movements intact.     Pupils: Pupils are equal, round, and reactive to light.   Cardiovascular:  Rate and Rhythm: Normal rate and regular rhythm.     Heart sounds: No murmur heard. Pulmonary:     Effort: Pulmonary effort is normal.     Breath sounds: Normal breath sounds.  Abdominal:     General: There is no distension.     Palpations: Abdomen is soft. There is no mass.     Tenderness: There is no abdominal tenderness. There is no right CVA tenderness or left CVA tenderness.  Skin:    Capillary Refill: Capillary refill takes less than 2 seconds.     Coloration: Skin is not pale.  Neurological:     General: No focal deficit present.     Mental Status: She is oriented to person, place, and time.  Psychiatric:        Behavior: Behavior normal.      UC Treatments / Results  Labs (all labs ordered are listed, but only abnormal results are displayed) Labs Reviewed  POCT URINALYSIS DIP (MANUAL ENTRY) - Abnormal; Notable for the following components:      Result Value   Ketones, POC UA moderate (40) (*)    Spec Grav, UA >=1.030 (*)    Blood, UA large (*)    Protein Ur, POC =30 (*)    All other components within normal limits    EKG   Radiology No results found.  Procedures Procedures (including critical care time)  Medications Ordered in UC Medications  ketorolac (TORADOL) 30 MG/ML injection 30 mg (has no administration in time range)    Initial Impression / Assessment and Plan / UC Course  I have reviewed the triage vital signs and the nursing notes.  Pertinent labs & imaging results that were available during my care of the patient were reviewed by me and considered in my medical decision making (see chart for details).        Urinalysis here shows a large amount of blood; urine is concentrated with an elevated specific gravity and there are some ketones.  Concern that she is dehydrated.  She also may have a stone and with her trouble emptying her bladder, and may be obstructive.  I have asked her to proceed to the emergency room for  higher level of evaluation and treatment then we can provide here in the urgent care setting Final Clinical Impressions(s) / UC Diagnoses   Final diagnoses:  Left flank pain     Discharge Instructions      You have been given a shot of Toradol 30 mg today.  Proceed to the emergency room for further evaluation and a higher level of evaluation and treatment then we can provide here in the urgent care setting     ED Prescriptions   None    PDMP not reviewed this encounter.   Barrett Henle, MD 06/15/22 2002

## 2022-06-15 NOTE — ED Notes (Signed)
Patient is being discharged from the Urgent Care and sent to the Emergency Department via POV . Per Dr. Fatima Sanger, patient is in need of higher level of care due to abdominal pain and difficulty urinating. Patient is aware and verbalizes understanding of plan of care.  Vitals:   06/15/22 1941  BP: 133/83  Pulse: 96  Resp: 16  Temp: 97.9 F (36.6 C)  SpO2: 96%

## 2022-06-15 NOTE — ED Triage Notes (Signed)
Patient with c/o left lower abdominal pain, nausea and vomiting. Patient states she vomited about 8 times today and was able to keep a little gingerale down. Patient states earlier in the day she felt like she had to urinate but when she tried she couldn't go.

## 2022-06-15 NOTE — ED Triage Notes (Signed)
Patient presents with left flank pain and vomiting. Sh has experienced urinary frequency as well. Symptoms began today.

## 2022-06-19 ENCOUNTER — Telehealth: Payer: Self-pay | Admitting: *Deleted

## 2022-06-19 NOTE — Telephone Encounter (Signed)
     Patient  visit on 06/15/2022  at [facility] ed  was for treatment   Have you been able to follow up with your primary care physician?no pain and has been feeling good will reach out to PCP if she chooses said she has transportation and all meds   The patient was able to obtain any needed medicine or equipment.  Are there diet recommendations that you are having difficulty following?  Patient expresses understanding of discharge instructions and education provided has no other needs at this time.    [place] [phone] [address] , [place] [address] Email : [email or web address]

## 2022-06-25 ENCOUNTER — Ambulatory Visit (INDEPENDENT_AMBULATORY_CARE_PROVIDER_SITE_OTHER): Payer: Medicare HMO

## 2022-06-25 DIAGNOSIS — I495 Sick sinus syndrome: Secondary | ICD-10-CM

## 2022-06-27 LAB — CUP PACEART REMOTE DEVICE CHECK
Battery Impedance: 2313 Ohm
Battery Remaining Longevity: 40 mo
Battery Voltage: 2.75 V
Brady Statistic AP VP Percent: 0 %
Brady Statistic AP VS Percent: 0 %
Brady Statistic AS VP Percent: 0 %
Brady Statistic AS VS Percent: 100 %
Date Time Interrogation Session: 20240312104823
Implantable Lead Connection Status: 753985
Implantable Lead Connection Status: 753985
Implantable Lead Implant Date: 20090930
Implantable Lead Implant Date: 20090930
Implantable Lead Location: 753859
Implantable Lead Location: 753860
Implantable Lead Model: 5076
Implantable Lead Model: 5076
Implantable Pulse Generator Implant Date: 20090930
Lead Channel Impedance Value: 510 Ohm
Lead Channel Impedance Value: 515 Ohm
Lead Channel Pacing Threshold Amplitude: 0.875 V
Lead Channel Pacing Threshold Amplitude: 1 V
Lead Channel Pacing Threshold Pulse Width: 0.4 ms
Lead Channel Pacing Threshold Pulse Width: 0.4 ms
Lead Channel Setting Pacing Amplitude: 2 V
Lead Channel Setting Pacing Amplitude: 2 V
Lead Channel Setting Pacing Pulse Width: 0.4 ms
Lead Channel Setting Sensing Sensitivity: 2 mV
Zone Setting Status: 755011
Zone Setting Status: 755011

## 2022-06-28 DIAGNOSIS — H353132 Nonexudative age-related macular degeneration, bilateral, intermediate dry stage: Secondary | ICD-10-CM | POA: Diagnosis not present

## 2022-06-28 DIAGNOSIS — Z961 Presence of intraocular lens: Secondary | ICD-10-CM | POA: Diagnosis not present

## 2022-07-31 DIAGNOSIS — L570 Actinic keratosis: Secondary | ICD-10-CM | POA: Diagnosis not present

## 2022-07-31 DIAGNOSIS — L821 Other seborrheic keratosis: Secondary | ICD-10-CM | POA: Diagnosis not present

## 2022-07-31 DIAGNOSIS — D225 Melanocytic nevi of trunk: Secondary | ICD-10-CM | POA: Diagnosis not present

## 2022-08-06 NOTE — Progress Notes (Signed)
Remote pacemaker transmission.   

## 2022-09-24 ENCOUNTER — Ambulatory Visit (INDEPENDENT_AMBULATORY_CARE_PROVIDER_SITE_OTHER): Payer: Medicare HMO

## 2022-09-24 DIAGNOSIS — I495 Sick sinus syndrome: Secondary | ICD-10-CM | POA: Diagnosis not present

## 2022-09-26 LAB — CUP PACEART REMOTE DEVICE CHECK
Battery Impedance: 2514 Ohm
Battery Remaining Longevity: 36 mo
Battery Voltage: 2.74 V
Brady Statistic AP VP Percent: 0 %
Brady Statistic AP VS Percent: 0 %
Brady Statistic AS VP Percent: 0 %
Brady Statistic AS VS Percent: 100 %
Date Time Interrogation Session: 20240611175502
Implantable Lead Connection Status: 753985
Implantable Lead Connection Status: 753985
Implantable Lead Implant Date: 20090930
Implantable Lead Implant Date: 20090930
Implantable Lead Location: 753859
Implantable Lead Location: 753860
Implantable Lead Model: 5076
Implantable Lead Model: 5076
Implantable Pulse Generator Implant Date: 20090930
Lead Channel Impedance Value: 449 Ohm
Lead Channel Impedance Value: 505 Ohm
Lead Channel Pacing Threshold Amplitude: 0.875 V
Lead Channel Pacing Threshold Amplitude: 1.125 V
Lead Channel Pacing Threshold Pulse Width: 0.4 ms
Lead Channel Pacing Threshold Pulse Width: 0.4 ms
Lead Channel Setting Pacing Amplitude: 2 V
Lead Channel Setting Pacing Amplitude: 2.25 V
Lead Channel Setting Pacing Pulse Width: 0.4 ms
Lead Channel Setting Sensing Sensitivity: 2 mV
Zone Setting Status: 755011
Zone Setting Status: 755011

## 2022-10-17 NOTE — Progress Notes (Signed)
Remote pacemaker transmission.   

## 2022-12-31 ENCOUNTER — Ambulatory Visit: Payer: Medicare HMO

## 2022-12-31 DIAGNOSIS — I495 Sick sinus syndrome: Secondary | ICD-10-CM

## 2022-12-31 LAB — CUP PACEART REMOTE DEVICE CHECK
Battery Impedance: 2717 Ohm
Battery Remaining Longevity: 34 mo
Battery Voltage: 2.74 V
Brady Statistic AP VP Percent: 0 %
Brady Statistic AP VS Percent: 0 %
Brady Statistic AS VP Percent: 0 %
Brady Statistic AS VS Percent: 100 %
Date Time Interrogation Session: 20240916114149
Implantable Lead Connection Status: 753985
Implantable Lead Connection Status: 753985
Implantable Lead Implant Date: 20090930
Implantable Lead Implant Date: 20090930
Implantable Lead Location: 753859
Implantable Lead Location: 753860
Implantable Lead Model: 5076
Implantable Lead Model: 5076
Implantable Pulse Generator Implant Date: 20090930
Lead Channel Impedance Value: 425 Ohm
Lead Channel Impedance Value: 507 Ohm
Lead Channel Pacing Threshold Amplitude: 0.875 V
Lead Channel Pacing Threshold Amplitude: 1 V
Lead Channel Pacing Threshold Pulse Width: 0.4 ms
Lead Channel Pacing Threshold Pulse Width: 0.4 ms
Lead Channel Setting Pacing Amplitude: 2 V
Lead Channel Setting Pacing Amplitude: 2 V
Lead Channel Setting Pacing Pulse Width: 0.4 ms
Lead Channel Setting Sensing Sensitivity: 2 mV
Zone Setting Status: 755011
Zone Setting Status: 755011

## 2023-01-16 NOTE — Progress Notes (Signed)
Remote pacemaker transmission.   

## 2023-02-28 DIAGNOSIS — M810 Age-related osteoporosis without current pathological fracture: Secondary | ICD-10-CM | POA: Diagnosis not present

## 2023-02-28 DIAGNOSIS — M8589 Other specified disorders of bone density and structure, multiple sites: Secondary | ICD-10-CM | POA: Diagnosis not present

## 2023-02-28 DIAGNOSIS — Z0000 Encounter for general adult medical examination without abnormal findings: Secondary | ICD-10-CM | POA: Diagnosis not present

## 2023-02-28 DIAGNOSIS — K219 Gastro-esophageal reflux disease without esophagitis: Secondary | ICD-10-CM | POA: Diagnosis not present

## 2023-02-28 DIAGNOSIS — I10 Essential (primary) hypertension: Secondary | ICD-10-CM | POA: Diagnosis not present

## 2023-02-28 DIAGNOSIS — Z860101 Personal history of adenomatous and serrated colon polyps: Secondary | ICD-10-CM | POA: Diagnosis not present

## 2023-02-28 DIAGNOSIS — Z23 Encounter for immunization: Secondary | ICD-10-CM | POA: Diagnosis not present

## 2023-02-28 DIAGNOSIS — Z950 Presence of cardiac pacemaker: Secondary | ICD-10-CM | POA: Diagnosis not present

## 2023-03-19 ENCOUNTER — Other Ambulatory Visit: Payer: Self-pay | Admitting: Internal Medicine

## 2023-03-19 DIAGNOSIS — Z1231 Encounter for screening mammogram for malignant neoplasm of breast: Secondary | ICD-10-CM

## 2023-04-01 ENCOUNTER — Ambulatory Visit: Payer: Medicare HMO

## 2023-04-01 DIAGNOSIS — I495 Sick sinus syndrome: Secondary | ICD-10-CM

## 2023-04-01 LAB — CUP PACEART REMOTE DEVICE CHECK
Battery Impedance: 3138 Ohm
Battery Remaining Longevity: 29 mo
Battery Voltage: 2.73 V
Brady Statistic AP VP Percent: 0 %
Brady Statistic AP VS Percent: 0 %
Brady Statistic AS VP Percent: 0 %
Brady Statistic AS VS Percent: 100 %
Date Time Interrogation Session: 20241216092416
Implantable Lead Connection Status: 753985
Implantable Lead Connection Status: 753985
Implantable Lead Implant Date: 20090930
Implantable Lead Implant Date: 20090930
Implantable Lead Location: 753859
Implantable Lead Location: 753860
Implantable Lead Model: 5076
Implantable Lead Model: 5076
Implantable Pulse Generator Implant Date: 20090930
Lead Channel Impedance Value: 516 Ohm
Lead Channel Impedance Value: 540 Ohm
Lead Channel Pacing Threshold Amplitude: 0.875 V
Lead Channel Pacing Threshold Amplitude: 1 V
Lead Channel Pacing Threshold Pulse Width: 0.4 ms
Lead Channel Pacing Threshold Pulse Width: 0.4 ms
Lead Channel Setting Pacing Amplitude: 2 V
Lead Channel Setting Pacing Amplitude: 2 V
Lead Channel Setting Pacing Pulse Width: 0.4 ms
Lead Channel Setting Sensing Sensitivity: 2 mV
Zone Setting Status: 755011
Zone Setting Status: 755011

## 2023-04-04 ENCOUNTER — Ambulatory Visit: Payer: Medicare HMO | Attending: Cardiovascular Disease | Admitting: Cardiovascular Disease

## 2023-04-04 ENCOUNTER — Encounter: Payer: Self-pay | Admitting: Cardiovascular Disease

## 2023-04-04 VITALS — BP 122/78 | HR 87 | Ht 64.5 in | Wt 195.8 lb

## 2023-04-04 DIAGNOSIS — I4510 Unspecified right bundle-branch block: Secondary | ICD-10-CM

## 2023-04-04 DIAGNOSIS — R55 Syncope and collapse: Secondary | ICD-10-CM

## 2023-04-04 DIAGNOSIS — I4719 Other supraventricular tachycardia: Secondary | ICD-10-CM

## 2023-04-04 DIAGNOSIS — I10 Essential (primary) hypertension: Secondary | ICD-10-CM

## 2023-04-04 DIAGNOSIS — Z950 Presence of cardiac pacemaker: Secondary | ICD-10-CM

## 2023-04-04 NOTE — Patient Instructions (Signed)

## 2023-04-04 NOTE — Progress Notes (Addendum)
Patient ID: Tiffany Baird, female   DOB: Mar 31, 1946, 77 y.o.   MRN: [id number]    Cardiology Office Note    Date:  04/04/2023   ID:  Tiffany Baird, DOB [birth year], MRN [id number]  PCP:  Merri Brunette, MD  Cardiologist:   Thurmon Fair, MD   Chief Complaint  Patient presents with   Pacemaker Check         History of Present Illness:  Tiffany Baird is a 77 y.o. female with a history of sinus pause probably related to neurocardiogenic syncope, without recurrence of syncope after implantation of a dual-chamber permanent pacemaker in 2009. She does not have any known structural heart disease by previous noninvasive testing performed in 2012. She very infrequently requires pacing, confirming the absence of true sinus node dysfunction.  She feels great.  She has no cardiovascular complaints.  She has not had syncope since pacemaker implantation.  She is planning a cruise to [place] in January.  Her dual-chamber Medtronic Adapta pacemaker was implanted in 2009 (Medtronic [id number] leads) and still has 29 months of remaining battery longevity.  Only has 0.2% atrial pacing and 0.1% ventricular pacing.  As before, the device has recorded several episodes of rate drop response and a handful of episodes of high ventricular rate (electrograms show that all of the episodes of rapid rates are due to paroxysmal atrial tachycardia with 1: 1 AV conduction).  Most of these episodes are very brief, under 10 seconds duration.   Past Medical History:  Diagnosis Date   Complication of anesthesia    pass out after awaken from anesthesia   GERD (gastroesophageal reflux disease)    H/O cardiovascular stress test 03/27/2011   EF 86%, no ischemia   H/O echocardiogram 03/27/2011   EF >55%, mild MR, mild TR, RV systolic pressure 30-58mmHg,    H/O: rheumatic fever    as a child   Head injury due to trauma 2009   fall   Headache    migraine history, last one in past few months   HTN (hypertension)    Hx of  cardiac pacemaker 01/14/08   medtronic adapta   Hx of syncope    X 18 years   NSVT (nonsustained ventricular tachycardia) (HCC)    on pacer checks   PSVT (paroxysmal supraventricular tachycardia) (HCC)    on pacer checks   RBBB    PSVT,    Sinus arrest 2009   6.7 sec pause with hx syncope    Past Surgical History:  Procedure Laterality Date   CHOLECYSTECTOMY     COLONOSCOPY W/ POLYPECTOMY  2012   dental implants     fractured leg     NM MYOCAR PERF WALL MOTION  03/27/11   normal   ORIF ANKLE FRACTURE Right 04/30/2016   Procedure: ORIF Right ankle;  Surgeon: Yolonda Kida, MD;  Location: [facility] OR;  Service: Orthopedics;  Laterality: Right;  Requests 70 mins   PACEMAKER INSERTION  01/14/2008   Medtronic    US ECHOCARDIOGRAPHY  03/27/11   mild TR, no significant valvular disease.    Current Outpatient Medications  Medication Sig Dispense Refill   ascorbic acid (VITAMIN C) 500 MG tablet Take 500 mg by mouth daily.     calcium carbonate (OS-CAL) 600 MG TABS tablet Take 600 mg by mouth daily.     cholecalciferol (VITAMIN D) 1000 UNITS tablet Take 2,000 Units by mouth daily.     EPINEPHrine (EPIPEN 2-PAK) 0.3 mg/0.3 mL IJ SOAJ injection  Inject 0.3 mg into the muscle as needed.     esomeprazole (NEXIUM) 20 MG capsule Take 20 mg by mouth 2 (two) times daily. May take an additional 20mg  for acid reflux     Glucos-Chond-Hyal Ac-Ca Fructo (MOVE FREE JOINT HEALTH ADVANCE) TABS Take 1 tablet by mouth 2 (two) times daily.     Multiple Vitamins-Minerals (PRESERVISION AREDS) CAPS Take 1 capsule by mouth 2 (two) times daily.      Naproxen Sodium (ALEVE PO) Take 1 capsule by mouth 2 (two) times daily.     Olmesartan-amLODIPine-HCTZ 40-10-12.5 MG TABS Take by mouth in the morning.     ondansetron (ZOFRAN-ODT) 4 MG disintegrating tablet Take 1 tablet (4 mg total) by mouth every 8 (eight) hours as needed for up to 10 doses for nausea or vomiting. 10 tablet 0   OVER THE COUNTER MEDICATION Take 1  tablet by mouth 2 (two) times daily.     OVER THE COUNTER MEDICATION Take 1 capsule by mouth in the morning and at bedtime.     OVER THE COUNTER MEDICATION Take 1 tablet by mouth daily in the afternoon.     No current facility-administered medications for this visit.    Allergies:   Codeine, Hydrocodone, Ash, Sumycin [tetracycline], and Tegretol [carbamazepine]   Social History   Socioeconomic History   Marital status: Married    Spouse name: Not on file   Number of children: Not on file   Years of education: Not on file   Highest education level: Not on file  Occupational History   Not on file  Tobacco Use   Smoking status: Never   Smokeless tobacco: Never  Vaping Use   Vaping status: Never Used  Substance and Sexual Activity   Alcohol use: Yes    Comment: one glass of wine  a month   Drug use: No   Sexual activity: Not on file  Other Topics Concern   Not on file  Social History Narrative   Not on file   Social Drivers of Health   Financial Resource Strain: Not on file  Food Insecurity: Not on file  Transportation Needs: Not on file  Physical Activity: Not on file  Stress: Not on file  Social Connections: Not on file     Family History:  The patient's family history includes Breast cancer in her mother; Cancer in her mother; Diabetes in her brother and mother; Heart disease in her father.   ROS:   Please see the history of present illness.    Review of Systems  All other systems reviewed and are negative.    PHYSICAL EXAM:   VS:  BP 122/78   Pulse 87   Ht 5' 4.5" (1.638 m)   Wt 195 lb 12.8 oz (88.8 kg)   SpO2 95%   BMI 33.09 kg/m      General: Alert, oriented x3, no distress, moderate to severely obese.  Healthy left subclavian pacemaker site. Head: no evidence of trauma, PERRL, EOMI, no exophtalmos or lid lag, no myxedema, no xanthelasma; normal ears, nose and oropharynx Neck: normal jugular venous pulsations and no hepatojugular reflux; brisk  carotid pulses without delay and no carotid bruits Chest: clear to auscultation, no signs of consolidation by percussion or palpation, normal fremitus, symmetrical and full respiratory excursions Cardiovascular: normal position and quality of the apical impulse, regular rhythm, normal first and second heart sounds, no murmurs, rubs or gallops Abdomen: no tenderness or distention, no masses by palpation, no abnormal pulsatility  or arterial bruits, normal bowel sounds, no hepatosplenomegaly Extremities: no clubbing, cyanosis or edema; 2+ radial, ulnar and brachial pulses bilaterally; 2+ right femoral, posterior tibial and dorsalis pedis pulses; 2+ left femoral, posterior tibial and dorsalis pedis pulses; no subclavian or femoral bruits Neurological: grossly nonfocal Psych: Normal mood and affect   Wt Readings from Last 3 Encounters:  04/04/23 195 lb 12.8 oz (88.8 kg)  06/15/22 220 lb (99.8 kg)  03/12/22 218 lb 9.6 oz (99.2 kg)      Studies/Labs Reviewed:   EKG: Unchanged normal sinus rhythm, chronic right bundle branch block, new LAD due to LAFB  EKG Interpretation Date/Time:  Thursday April 04 2023 11:38:32 EST Ventricular Rate:  87 PR Interval:  164 QRS Duration:  114 QT Interval:  372 QTC Calculation: 447 R Axis:   -69  Text Interpretation: Normal sinus rhythm Left axis deviation Right bundle branch block Left anterior fasicular block Anterior infarct , age undetermined When compared with ECG of 30-Apr-2016 12:35, Left anterior fasicular block is new Confirmed by Menachem Urbanek [phone]) on 04/04/2023 3:10:06 PM          ASSESSMENT:    1. Vasovagal syncope   2. PAT (paroxysmal atrial tachycardia) (HCC)   3. Hx of cardiac pacemaker   4. Primary hypertension   5. RBBB      PLAN:  In order of problems listed above:  Vasovagal syncope: Asymptomatic since pacemaker implantation, relatively frequent episodes of rate drop intervention. PAT: Brief asymptomatic events that  did not require therapy. PM: Normal device function but still over 2 years of estimated longevity.  When the time comes for generator change out she can receive an MRI conditional generator and the whole system will be MRI conditional. HTN: Very well-controlled RBBB: Chronic conduction abnormality.  No clinical or echo evidence of structural heart disease.  She does not have hypersomnolence or other symptoms of obstructive sleep apnea.   Medication Adjustments/Labs and Tests Ordered: Current medicines are reviewed at length with the patient today.  Concerns regarding medicines are outlined above.  Medication changes, Labs and Tests ordered today are listed below. Patient Instructions  Medication Instructions:  No changes *If you need a refill on your cardiac medications before your next appointment, please call your pharmacy*  Follow-Up: At [facility], you and your health needs are our priority.  As part of our continuing mission to provide you with exceptional heart care, we have created designated Provider Care Teams.  These Care Teams include your primary Cardiologist (physician) and Advanced Practice Providers (APPs -  Physician Assistants and Nurse Practitioners) who all work together to provide you with the care you need, when you need it.  We recommend signing up for the patient portal called "MyChart".  Sign up information is provided on this After Visit Summary.  MyChart is used to connect with patients for Virtual Visits (Telemedicine).  Patients are able to view lab/test results, encounter notes, upcoming appointments, etc.  Non-urgent messages can be sent to your provider as well.   To learn more about what you can do with MyChart, go to [email or web address].    Your next appointment:   1 year(s)  Provider:   Thurmon Fair, MD              Signed, Thurmon Fair, MD  04/04/2023 3:10 PM    [facility] [address] [place], [place], [place]   [address] Phone: [phone]; Fax: [phone]

## 2023-05-08 NOTE — Progress Notes (Signed)
Remote pacemaker transmission.   

## 2023-05-24 ENCOUNTER — Ambulatory Visit
Admission: RE | Admit: 2023-05-24 | Discharge: 2023-05-24 | Disposition: A | Discharge status: Home / Self Care | Payer: Medicare HMO | Source: Ambulatory Visit | Attending: Internal Medicine | Admitting: Internal Medicine

## 2023-05-24 DIAGNOSIS — Z1231 Encounter for screening mammogram for malignant neoplasm of breast: Secondary | ICD-10-CM | POA: Diagnosis not present

## 2023-07-01 ENCOUNTER — Ambulatory Visit (INDEPENDENT_AMBULATORY_CARE_PROVIDER_SITE_OTHER): Payer: Medicare HMO

## 2023-07-01 DIAGNOSIS — I4719 Other supraventricular tachycardia: Secondary | ICD-10-CM

## 2023-07-01 DIAGNOSIS — I495 Sick sinus syndrome: Secondary | ICD-10-CM

## 2023-07-03 LAB — CUP PACEART REMOTE DEVICE CHECK
Battery Impedance: 3949 Ohm
Battery Remaining Longevity: 21 mo
Battery Voltage: 2.71 V
Brady Statistic AP VP Percent: 0 %
Brady Statistic AP VS Percent: 0 %
Brady Statistic AS VP Percent: 0 %
Brady Statistic AS VS Percent: 100 %
Date Time Interrogation Session: 20250318105544
Implantable Lead Connection Status: 753985
Implantable Lead Connection Status: 753985
Implantable Lead Implant Date: 20090930
Implantable Lead Implant Date: 20090930
Implantable Lead Location: 753859
Implantable Lead Location: 753860
Implantable Lead Model: 5076
Implantable Lead Model: 5076
Implantable Pulse Generator Implant Date: 20090930
Lead Channel Impedance Value: 475 Ohm
Lead Channel Impedance Value: 542 Ohm
Lead Channel Pacing Threshold Amplitude: 0.875 V
Lead Channel Pacing Threshold Amplitude: 0.875 V
Lead Channel Pacing Threshold Pulse Width: 0.4 ms
Lead Channel Pacing Threshold Pulse Width: 0.4 ms
Lead Channel Setting Pacing Amplitude: 1.75 V
Lead Channel Setting Pacing Amplitude: 2 V
Lead Channel Setting Pacing Pulse Width: 0.4 ms
Lead Channel Setting Sensing Sensitivity: 2 mV
Zone Setting Status: 755011
Zone Setting Status: 755011

## 2023-07-08 ENCOUNTER — Encounter: Payer: Self-pay | Admitting: Cardiovascular Disease

## 2023-07-31 DIAGNOSIS — L820 Inflamed seborrheic keratosis: Secondary | ICD-10-CM | POA: Diagnosis not present

## 2023-07-31 DIAGNOSIS — D225 Melanocytic nevi of trunk: Secondary | ICD-10-CM | POA: Diagnosis not present

## 2023-07-31 DIAGNOSIS — L821 Other seborrheic keratosis: Secondary | ICD-10-CM | POA: Diagnosis not present

## 2023-07-31 DIAGNOSIS — L814 Other melanin hyperpigmentation: Secondary | ICD-10-CM | POA: Diagnosis not present

## 2023-07-31 DIAGNOSIS — L570 Actinic keratosis: Secondary | ICD-10-CM | POA: Diagnosis not present

## 2023-07-31 DIAGNOSIS — D485 Neoplasm of uncertain behavior of skin: Secondary | ICD-10-CM | POA: Diagnosis not present

## 2023-08-08 DIAGNOSIS — H3530 Unspecified macular degeneration: Secondary | ICD-10-CM | POA: Diagnosis not present

## 2023-08-08 DIAGNOSIS — H353132 Nonexudative age-related macular degeneration, bilateral, intermediate dry stage: Secondary | ICD-10-CM | POA: Diagnosis not present

## 2023-08-08 DIAGNOSIS — Z961 Presence of intraocular lens: Secondary | ICD-10-CM | POA: Diagnosis not present

## 2023-08-21 NOTE — Addendum Note (Signed)
 Addended by: Edra Govern D on: 08/21/2023 01:06 PM   Modules accepted: Orders

## 2023-08-21 NOTE — Progress Notes (Signed)
 Remote pacemaker transmission.

## 2023-08-29 DIAGNOSIS — H353211 Exudative age-related macular degeneration, right eye, with active choroidal neovascularization: Secondary | ICD-10-CM | POA: Diagnosis not present

## 2023-08-29 DIAGNOSIS — Z961 Presence of intraocular lens: Secondary | ICD-10-CM | POA: Diagnosis not present

## 2023-08-29 DIAGNOSIS — H353123 Nonexudative age-related macular degeneration, left eye, advanced atrophic without subfoveal involvement: Secondary | ICD-10-CM | POA: Diagnosis not present

## 2023-10-15 ENCOUNTER — Ambulatory Visit (INDEPENDENT_AMBULATORY_CARE_PROVIDER_SITE_OTHER)

## 2023-10-15 DIAGNOSIS — I495 Sick sinus syndrome: Secondary | ICD-10-CM

## 2023-10-17 ENCOUNTER — Ambulatory Visit: Payer: Self-pay | Admitting: Cardiovascular Disease

## 2023-10-17 LAB — CUP PACEART REMOTE DEVICE CHECK
Battery Impedance: 4607 Ohm
Battery Remaining Longevity: 16 mo
Battery Voltage: 2.67 V
Brady Statistic AP VP Percent: 0 %
Brady Statistic AP VS Percent: 0 %
Brady Statistic AS VP Percent: 0 %
Brady Statistic AS VS Percent: 100 %
Date Time Interrogation Session: 20250702152701
Implantable Lead Connection Status: 753985
Implantable Lead Connection Status: 753985
Implantable Lead Implant Date: 20090930
Implantable Lead Implant Date: 20090930
Implantable Lead Location: 753859
Implantable Lead Location: 753860
Implantable Lead Model: 5076
Implantable Lead Model: 5076
Implantable Pulse Generator Implant Date: 20090930
Lead Channel Impedance Value: 497 Ohm
Lead Channel Impedance Value: 507 Ohm
Lead Channel Pacing Threshold Amplitude: 0.875 V
Lead Channel Pacing Threshold Amplitude: 1 V
Lead Channel Pacing Threshold Pulse Width: 0.4 ms
Lead Channel Pacing Threshold Pulse Width: 0.4 ms
Lead Channel Setting Pacing Amplitude: 2 V
Lead Channel Setting Pacing Amplitude: 2 V
Lead Channel Setting Pacing Pulse Width: 0.4 ms
Lead Channel Setting Sensing Sensitivity: 2 mV
Zone Setting Status: 755011
Zone Setting Status: 755011

## 2023-10-29 DIAGNOSIS — B070 Plantar wart: Secondary | ICD-10-CM | POA: Diagnosis not present

## 2023-10-29 DIAGNOSIS — Z9889 Other specified postprocedural states: Secondary | ICD-10-CM | POA: Diagnosis not present

## 2023-10-29 DIAGNOSIS — M792 Neuralgia and neuritis, unspecified: Secondary | ICD-10-CM | POA: Diagnosis not present

## 2023-10-29 DIAGNOSIS — M19079 Primary osteoarthritis, unspecified ankle and foot: Secondary | ICD-10-CM | POA: Diagnosis not present

## 2023-10-29 DIAGNOSIS — L84 Corns and callosities: Secondary | ICD-10-CM | POA: Diagnosis not present

## 2023-10-29 DIAGNOSIS — I739 Peripheral vascular disease, unspecified: Secondary | ICD-10-CM | POA: Diagnosis not present

## 2023-11-12 DIAGNOSIS — I739 Peripheral vascular disease, unspecified: Secondary | ICD-10-CM | POA: Diagnosis not present

## 2023-11-12 DIAGNOSIS — L84 Corns and callosities: Secondary | ICD-10-CM | POA: Diagnosis not present

## 2023-11-12 DIAGNOSIS — M792 Neuralgia and neuritis, unspecified: Secondary | ICD-10-CM | POA: Diagnosis not present

## 2023-11-12 DIAGNOSIS — M19079 Primary osteoarthritis, unspecified ankle and foot: Secondary | ICD-10-CM | POA: Diagnosis not present

## 2023-11-12 DIAGNOSIS — B070 Plantar wart: Secondary | ICD-10-CM | POA: Diagnosis not present

## 2023-11-12 DIAGNOSIS — Z9889 Other specified postprocedural states: Secondary | ICD-10-CM | POA: Diagnosis not present

## 2023-11-26 DIAGNOSIS — M19079 Primary osteoarthritis, unspecified ankle and foot: Secondary | ICD-10-CM | POA: Diagnosis not present

## 2023-11-26 DIAGNOSIS — Z9889 Other specified postprocedural states: Secondary | ICD-10-CM | POA: Diagnosis not present

## 2023-11-26 DIAGNOSIS — M792 Neuralgia and neuritis, unspecified: Secondary | ICD-10-CM | POA: Diagnosis not present

## 2023-11-26 DIAGNOSIS — S90229A Contusion of unspecified lesser toe(s) with damage to nail, initial encounter: Secondary | ICD-10-CM | POA: Diagnosis not present

## 2023-11-26 DIAGNOSIS — I739 Peripheral vascular disease, unspecified: Secondary | ICD-10-CM | POA: Diagnosis not present

## 2023-11-26 DIAGNOSIS — L84 Corns and callosities: Secondary | ICD-10-CM | POA: Diagnosis not present

## 2023-11-26 DIAGNOSIS — B070 Plantar wart: Secondary | ICD-10-CM | POA: Diagnosis not present

## 2023-12-17 DIAGNOSIS — S90229A Contusion of unspecified lesser toe(s) with damage to nail, initial encounter: Secondary | ICD-10-CM | POA: Diagnosis not present

## 2023-12-17 DIAGNOSIS — M792 Neuralgia and neuritis, unspecified: Secondary | ICD-10-CM | POA: Diagnosis not present

## 2023-12-17 DIAGNOSIS — M19079 Primary osteoarthritis, unspecified ankle and foot: Secondary | ICD-10-CM | POA: Diagnosis not present

## 2023-12-17 DIAGNOSIS — I739 Peripheral vascular disease, unspecified: Secondary | ICD-10-CM | POA: Diagnosis not present

## 2023-12-17 DIAGNOSIS — L84 Corns and callosities: Secondary | ICD-10-CM | POA: Diagnosis not present

## 2023-12-17 DIAGNOSIS — Z9889 Other specified postprocedural states: Secondary | ICD-10-CM | POA: Diagnosis not present

## 2023-12-17 DIAGNOSIS — B070 Plantar wart: Secondary | ICD-10-CM | POA: Diagnosis not present

## 2024-01-02 DIAGNOSIS — B070 Plantar wart: Secondary | ICD-10-CM | POA: Diagnosis not present

## 2024-01-02 DIAGNOSIS — H353211 Exudative age-related macular degeneration, right eye, with active choroidal neovascularization: Secondary | ICD-10-CM | POA: Diagnosis not present

## 2024-01-02 DIAGNOSIS — S90229A Contusion of unspecified lesser toe(s) with damage to nail, initial encounter: Secondary | ICD-10-CM | POA: Diagnosis not present

## 2024-01-02 DIAGNOSIS — H353123 Nonexudative age-related macular degeneration, left eye, advanced atrophic without subfoveal involvement: Secondary | ICD-10-CM | POA: Diagnosis not present

## 2024-01-02 DIAGNOSIS — I739 Peripheral vascular disease, unspecified: Secondary | ICD-10-CM | POA: Diagnosis not present

## 2024-01-02 DIAGNOSIS — L84 Corns and callosities: Secondary | ICD-10-CM | POA: Diagnosis not present

## 2024-01-02 DIAGNOSIS — Z9889 Other specified postprocedural states: Secondary | ICD-10-CM | POA: Diagnosis not present

## 2024-01-02 DIAGNOSIS — Z961 Presence of intraocular lens: Secondary | ICD-10-CM | POA: Diagnosis not present

## 2024-01-02 DIAGNOSIS — M792 Neuralgia and neuritis, unspecified: Secondary | ICD-10-CM | POA: Diagnosis not present

## 2024-01-02 DIAGNOSIS — M19079 Primary osteoarthritis, unspecified ankle and foot: Secondary | ICD-10-CM | POA: Diagnosis not present

## 2024-01-14 ENCOUNTER — Ambulatory Visit

## 2024-01-14 DIAGNOSIS — I495 Sick sinus syndrome: Secondary | ICD-10-CM | POA: Diagnosis not present

## 2024-01-16 DIAGNOSIS — R3129 Other microscopic hematuria: Secondary | ICD-10-CM | POA: Diagnosis not present

## 2024-01-16 DIAGNOSIS — Z9889 Other specified postprocedural states: Secondary | ICD-10-CM | POA: Diagnosis not present

## 2024-01-16 DIAGNOSIS — S90229A Contusion of unspecified lesser toe(s) with damage to nail, initial encounter: Secondary | ICD-10-CM | POA: Diagnosis not present

## 2024-01-16 DIAGNOSIS — M19079 Primary osteoarthritis, unspecified ankle and foot: Secondary | ICD-10-CM | POA: Diagnosis not present

## 2024-01-16 DIAGNOSIS — I739 Peripheral vascular disease, unspecified: Secondary | ICD-10-CM | POA: Diagnosis not present

## 2024-01-16 DIAGNOSIS — B070 Plantar wart: Secondary | ICD-10-CM | POA: Diagnosis not present

## 2024-01-16 DIAGNOSIS — M792 Neuralgia and neuritis, unspecified: Secondary | ICD-10-CM | POA: Diagnosis not present

## 2024-01-16 DIAGNOSIS — L84 Corns and callosities: Secondary | ICD-10-CM | POA: Diagnosis not present

## 2024-01-16 LAB — CUP PACEART REMOTE DEVICE CHECK
Battery Impedance: 7138 Ohm
Battery Remaining Longevity: 1 mo — CL
Battery Voltage: 2.62 V
Brady Statistic AP VP Percent: 0 %
Brady Statistic AP VS Percent: 0 %
Brady Statistic AS VP Percent: 0 %
Brady Statistic AS VS Percent: 100 %
Date Time Interrogation Session: 20251001101925
Implantable Lead Connection Status: 753985
Implantable Lead Connection Status: 753985
Implantable Lead Implant Date: 20090930
Implantable Lead Implant Date: 20090930
Implantable Lead Location: 753859
Implantable Lead Location: 753860
Implantable Lead Model: 5076
Implantable Lead Model: 5076
Implantable Pulse Generator Implant Date: 20090930
Lead Channel Impedance Value: 476 Ohm
Lead Channel Impedance Value: 540 Ohm
Lead Channel Pacing Threshold Amplitude: 0.875 V
Lead Channel Pacing Threshold Amplitude: 1 V
Lead Channel Pacing Threshold Pulse Width: 0.4 ms
Lead Channel Pacing Threshold Pulse Width: 0.4 ms
Lead Channel Setting Pacing Amplitude: 1.875
Lead Channel Setting Pacing Amplitude: 2 V
Lead Channel Setting Pacing Pulse Width: 0.4 ms
Lead Channel Setting Sensing Sensitivity: 2 mV
Zone Setting Status: 755011
Zone Setting Status: 755011

## 2024-01-17 ENCOUNTER — Ambulatory Visit: Payer: Self-pay | Admitting: Cardiovascular Disease

## 2024-01-20 NOTE — Telephone Encounter (Signed)
 Patient is following up. She says she will be out of town 11/07, but Dr. Francyne said that she needs to be worked in. Please advise.

## 2024-01-20 NOTE — Progress Notes (Signed)
 Remote PPM Transmission

## 2024-01-23 NOTE — Progress Notes (Signed)
 Remote PPM Transmission

## 2024-01-23 NOTE — Telephone Encounter (Signed)
 Spoke with the patient- Appt made for 03/09/24 at 1000 Plan for Gen Change Dec 4th at 3:30

## 2024-02-17 ENCOUNTER — Ambulatory Visit: Attending: Cardiovascular Disease

## 2024-02-17 ENCOUNTER — Encounter

## 2024-02-17 LAB — CUP PACEART REMOTE DEVICE CHECK
Battery Impedance: 8244 Ohm
Battery Voltage: 2.63 V
Brady Statistic RV Percent Paced: 0 %
Date Time Interrogation Session: 20251103113147
Implantable Lead Connection Status: 753985
Implantable Lead Connection Status: 753985
Implantable Lead Implant Date: 20090930
Implantable Lead Implant Date: 20090930
Implantable Lead Location: 753859
Implantable Lead Location: 753860
Implantable Lead Model: 5076
Implantable Lead Model: 5076
Implantable Pulse Generator Implant Date: 20090930
Lead Channel Impedance Value: 516 Ohm
Lead Channel Impedance Value: 67 Ohm
Lead Channel Setting Pacing Amplitude: 2 V
Lead Channel Setting Pacing Pulse Width: 0.4 ms
Lead Channel Setting Sensing Sensitivity: 2 mV
Zone Setting Status: 755011
Zone Setting Status: 755011

## 2024-02-18 ENCOUNTER — Telehealth: Payer: Self-pay

## 2024-02-18 NOTE — Telephone Encounter (Signed)
 Monthly battery check.  RRT reached 02/10/2024,  Generator change out scheduled for 03/19/24 with Dr. Francyne.  Appt with Dr. Francyne on 02/21/24 to discuss procedure.

## 2024-02-20 ENCOUNTER — Ambulatory Visit: Payer: Self-pay | Admitting: Cardiovascular Disease

## 2024-03-09 ENCOUNTER — Ambulatory Visit: Admitting: Cardiovascular Disease

## 2024-03-17 ENCOUNTER — Ambulatory Visit: Attending: Physician Assistant | Admitting: Physician Assistant

## 2024-03-17 VITALS — BP 142/90 | HR 95 | Ht 64.5 in | Wt 202.0 lb

## 2024-03-17 DIAGNOSIS — R55 Syncope and collapse: Secondary | ICD-10-CM

## 2024-03-17 DIAGNOSIS — I495 Sick sinus syndrome: Secondary | ICD-10-CM

## 2024-03-17 DIAGNOSIS — I4510 Unspecified right bundle-branch block: Secondary | ICD-10-CM

## 2024-03-17 DIAGNOSIS — I10 Essential (primary) hypertension: Secondary | ICD-10-CM | POA: Diagnosis not present

## 2024-03-17 DIAGNOSIS — I4719 Other supraventricular tachycardia: Secondary | ICD-10-CM | POA: Diagnosis not present

## 2024-03-17 DIAGNOSIS — Z950 Presence of cardiac pacemaker: Secondary | ICD-10-CM

## 2024-03-17 NOTE — Patient Instructions (Addendum)
 Medication Instructions:  Your physician recommends that you continue on your current medications as directed. Please refer to the Current Medication list given to you today.  *If you need a refill on your cardiac medications before your next appointment, please call your pharmacy*  Lab Work: None ordered If you have labs (blood work) drawn today and your tests are completely normal, you will receive your results only by: MyChart Message (if you have MyChart) OR A paper copy in the mail If you have any lab test that is abnormal or we need to change your treatment, we will call you to review the results.  Testing/Procedures: None ordered  Follow-Up: At [facility], you and your health needs are our priority.  As part of our continuing mission to provide you with exceptional heart care, our providers are all part of one team.  This team includes your primary Cardiologist (physician) and Advanced Practice Providers or APPs (Physician Assistants and Nurse Practitioners) who all work together to provide you with the care you need, when you need it.  Your next appointment:      Provider:   Jerel Balding, MD    We recommend signing up for the patient portal called MyChart.  Sign up information is provided on this After Visit Summary.  MyChart is used to connect with patients for Virtual Visits (Telemedicine).  Patients are able to view lab/test results, encounter notes, upcoming appointments, etc.  Non-urgent messages can be sent to your provider as well.   To learn more about what you can do with MyChart, go to [email or web address].   Other Instructions You can have a light meal up until 7am on the day of your procedure. Wash with the scrub the night before and the morning of.

## 2024-03-17 NOTE — H&P (View-Only) (Signed)
 Cardiology Office Note   Date:  03/17/2024  ID:  Nashonda, Limberg [birth year], MRN [id number] PCP: Clarice Nottingham, MD  [facility] Providers Cardiologist:  Jerel Balding, MD   History of Present Illness Tiffany Baird is a 78 y.o. female with a past medical history of sinus pause probably related to neurocardiogenic syncope without recurrence of syncope after implantation of dual-chamber permanent pacemaker in 2009 here for follow-up appointment.  She does not have any history of structural heart disease by previous noninvasive testing performed in 2012.  Very infrequently requires pacing confirming absence of true sinus node dysfunction.  She was last seen a year ago by Dr. Balding she was feeling great and had no cardiovascular complaints.  Has not had any syncope since pacemaker implantation.  Was planning to do a cruise that January (2025).  Her dual-chamber Medtronic Adapta pacemaker was implanted in 2009 and still has some battery longevity however is needing to be replaced today.  Device recorded several episodes of a rate drop response and a handful of episodes of high ventricular rate all episodes of rapid rates are due to paroxysmal atrial tachycardia with one-to-one AV conduction.  Most these episodes are very brief under 10 seconds in duration.  Today, she presents with a hx of dual chamber Medtronic pacemaker who presents for a generator change education.   She has had this pacemaker since January 14, 2008, without device-related problems. Her rhythm has been stable over the past year, with rare brief atrial tachycardia episodes lasting about ten seconds and no associated shortness of breath, lightheadedness, or dizziness.  Her blood pressure is slightly elevated today but has been well controlled on Benicar (olmesartan) for hypertension.  She has peripheral artery disease in her legs but has no leg pain, claudication, or swelling.  She has not had any syncopal  episodes since the pacemaker was implanted.  She has a right bundle branch block that has remained stable for years.  Reports no shortness of breath nor dyspnea on exertion. Reports no chest pain, pressure, or tightness. No edema, orthopnea, PND. Reports no palpitations.   Discussed the use of AI scribe software for clinical note transcription with the patient, who gave verbal consent to proceed.  ROS: Pertinent ROS in HPI  Studies Reviewed      None.       Physical Exam VS:  BP (!) 142/90   Pulse 95   Ht 5' 4.5 (1.638 m)   Wt 202 lb (91.6 kg)   SpO2 96%   BMI 34.14 kg/m        Wt Readings from Last 3 Encounters:  03/17/24 202 lb (91.6 kg)  04/04/23 195 lb 12.8 oz (88.8 kg)  06/15/22 220 lb (99.8 kg)    GEN: Well nourished, well developed in no acute distress NECK: No JVD; No carotid bruits CARDIAC: RRR, no murmurs, rubs, gallops RESPIRATORY:  Clear to auscultation without rales, wheezing or rhonchi  ABDOMEN: Soft, non-tender, non-distended EXTREMITIES:  No edema; No deformity   ASSESSMENT AND PLAN  Cardiac pacemaker with planned generator change Dual chamber Medtronic pacemaker since 2009, functioning well. Generator change scheduled for December 4th, 2025.  -EKG shows stable right bundle branch block. Heart rate in the 90s. -BMP and CBC ordered today - Provided instructions for generator change, including incision care and post-procedure symptoms to monitor. - Instructed to wash incision site with Hibiclens  the night before and morning of the procedure. - Advised to keep incision site bone dry for  seven days post-procedure. - Instructed to eat a light breakfast before 7 AM on the day of the procedure. - Advised to arrive at the hospital at 1 PM for the procedure. - Ensured follow-up with Doctor C's nurse for further instructions and letter.  Paroxysmal atrial tachycardia Occasional episodes lasting about ten seconds, not affecting heart pump function.  Well-controlled rhythm.  Right bundle branch block Stable compared to last year's EKG. No major changes noted.  Primary hypertension Blood pressure slightly elevated today but generally well-controlled with olmesartan. No immediate need for medication adjustment. - Monitor blood pressure at home, especially if diastolic is above 85. - Will consider adding HCTZ if blood pressure remains elevated.  Peripheral artery disease of lower extremities No symptoms of leg pain or swelling. Condition well-managed.  History of vasovagal syncope, resolved with pacemaker No further episodes of syncope since pacemaker implantation. Pacemaker functioning well to prevent syncope.      Dispo: She can follow-up post generator change.  Signed, Orren LOISE Fabry, PA-C

## 2024-03-17 NOTE — Progress Notes (Signed)
 Cardiology Office Note   Date:  03/17/2024  ID:  Tiffany Baird, Tiffany Baird [birth year], MRN [id number] PCP: Clarice Nottingham, MD  [facility] Providers Cardiologist:  Jerel Balding, MD   History of Present Illness Tiffany Baird is a 78 y.o. female with a past medical history of sinus pause probably related to neurocardiogenic syncope without recurrence of syncope after implantation of dual-chamber permanent pacemaker in 2009 here for follow-up appointment.  She does not have any history of structural heart disease by previous noninvasive testing performed in 2012.  Very infrequently requires pacing confirming absence of true sinus node dysfunction.  She was last seen a year ago by Dr. Balding she was feeling great and had no cardiovascular complaints.  Has not had any syncope since pacemaker implantation.  Was planning to do a cruise that January (2025).  Her dual-chamber Medtronic Adapta pacemaker was implanted in 2009 and still has some battery longevity however is needing to be replaced today.  Device recorded several episodes of a rate drop response and a handful of episodes of high ventricular rate all episodes of rapid rates are due to paroxysmal atrial tachycardia with one-to-one AV conduction.  Most these episodes are very brief under 10 seconds in duration.  Today, she presents with a hx of dual chamber Medtronic pacemaker who presents for a generator change education.   She has had this pacemaker since January 14, 2008, without device-related problems. Her rhythm has been stable over the past year, with rare brief atrial tachycardia episodes lasting about ten seconds and no associated shortness of breath, lightheadedness, or dizziness.  Her blood pressure is slightly elevated today but has been well controlled on Benicar (olmesartan) for hypertension.  She has peripheral artery disease in her legs but has no leg pain, claudication, or swelling.  She has not had any syncopal  episodes since the pacemaker was implanted.  She has a right bundle branch block that has remained stable for years.  Reports no shortness of breath nor dyspnea on exertion. Reports no chest pain, pressure, or tightness. No edema, orthopnea, PND. Reports no palpitations.   Discussed the use of AI scribe software for clinical note transcription with the patient, who gave verbal consent to proceed.  ROS: Pertinent ROS in HPI  Studies Reviewed      None.       Physical Exam VS:  BP (!) 142/90   Pulse 95   Ht 5' 4.5 (1.638 m)   Wt 202 lb (91.6 kg)   SpO2 96%   BMI 34.14 kg/m        Wt Readings from Last 3 Encounters:  03/17/24 202 lb (91.6 kg)  04/04/23 195 lb 12.8 oz (88.8 kg)  06/15/22 220 lb (99.8 kg)    GEN: Well nourished, well developed in no acute distress NECK: No JVD; No carotid bruits CARDIAC: RRR, no murmurs, rubs, gallops RESPIRATORY:  Clear to auscultation without rales, wheezing or rhonchi  ABDOMEN: Soft, non-tender, non-distended EXTREMITIES:  No edema; No deformity   ASSESSMENT AND PLAN  Cardiac pacemaker with planned generator change Dual chamber Medtronic pacemaker since 2009, functioning well. Generator change scheduled for December 4th, 2025.  -EKG shows stable right bundle branch block. Heart rate in the 90s. -BMP and CBC ordered today - Provided instructions for generator change, including incision care and post-procedure symptoms to monitor. - Instructed to wash incision site with Hibiclens  the night before and morning of the procedure. - Advised to keep incision site bone dry for  seven days post-procedure. - Instructed to eat a light breakfast before 7 AM on the day of the procedure. - Advised to arrive at the hospital at 1 PM for the procedure. - Ensured follow-up with Doctor C's nurse for further instructions and letter.  Paroxysmal atrial tachycardia Occasional episodes lasting about ten seconds, not affecting heart pump function.  Well-controlled rhythm.  Right bundle branch block Stable compared to last year's EKG. No major changes noted.  Primary hypertension Blood pressure slightly elevated today but generally well-controlled with olmesartan. No immediate need for medication adjustment. - Monitor blood pressure at home, especially if diastolic is above 85. - Will consider adding HCTZ if blood pressure remains elevated.  Peripheral artery disease of lower extremities No symptoms of leg pain or swelling. Condition well-managed.  History of vasovagal syncope, resolved with pacemaker No further episodes of syncope since pacemaker implantation. Pacemaker functioning well to prevent syncope.      Dispo: She can follow-up post generator change.  Signed, Orren LOISE Fabry, PA-C

## 2024-03-18 ENCOUNTER — Telehealth: Payer: Self-pay | Admitting: Emergency Medicine

## 2024-03-18 LAB — CBC
Hematocrit: 41.9 % (ref 34.0–46.6)
Hemoglobin: 13.3 g/dL (ref 11.1–15.9)
MCH: 28.3 pg (ref 26.6–33.0)
MCHC: 31.7 g/dL (ref 31.5–35.7)
MCV: 89 fL (ref 79–97)
Platelets: 286 x10E3/uL (ref 150–450)
RBC: 4.7 x10E6/uL (ref 3.77–5.28)
RDW: 12.6 % (ref 11.7–15.4)
WBC: 8.8 x10E3/uL (ref 3.4–10.8)

## 2024-03-18 LAB — BASIC METABOLIC PANEL WITH GFR
BUN/Creatinine Ratio: 30 — AB (ref 12–28)
BUN: 23 mg/dL (ref 8–27)
CO2: 24 mmol/L (ref 20–29)
Calcium: 9.8 mg/dL (ref 8.7–10.3)
Chloride: 103 mmol/L (ref 96–106)
Creatinine, Ser: 0.77 mg/dL (ref 0.57–1.00)
Glucose: 120 mg/dL — AB (ref 70–99)
Potassium: 4.5 mmol/L (ref 3.5–5.2)
Sodium: 141 mmol/L (ref 134–144)
eGFR: 78 mL/min/1.73 (ref >59)

## 2024-03-18 NOTE — Telephone Encounter (Signed)
 Cardiac pacemaker with planned generator change Dual chamber Medtronic pacemaker since 2009, functioning well. Generator change scheduled for December 4th, 2025.  -EKG shows stable right bundle branch block. Heart rate in the 90s. -BMP and CBC ordered today - Provided instructions for generator change, including incision care and post-procedure symptoms to monitor. - Instructed to wash incision site with Hibiclens  the night before and morning of the procedure. - Advised to keep incision site bone dry for seven days post-procedure. - Instructed to eat a light breakfast before 7 AM on the day of the procedure. - Advised to arrive at the hospital at 1 PM for the procedure. - Ensured follow-up with Doctor C's nurse for further instructions and letter.   Called the patient and went over all of the instructions below. Sent through [employer] as well. She verbalized understanding of all information.        Implantable Device Instructions    Tiffany Baird  03/18/2024  You are scheduled for a PPM generator change on Thursday, December 4 with Dr. Jerel Croitoru.  1. Pre procedure Lab testing: Labs completed today   2. Please arrive at the [facility] (Main Entrance A) at [facility]: [address] [place], [place] [address] at 1:30 PM (This time is 2 hour(s) before your procedure to ensure your preparation).   Free valet parking service is available. You will check in at ADMITTING. The support person will be asked to wait in the waiting room.  It is OK to have someone drop you off and come back when you are ready to be discharged.          Special note: Every effort is made to have your procedure done on time. Please understand that emergencies sometimes delay  scheduled procedures.  3.  You can have a light breakfast no later than 8am, then nothing else to eat.     4.  Medication instructions:  On the morning of your procedure you may take all regulary scheduled medications.       !!IF ANY NEW MEDICATIONS ARE STARTED AFTER TODAY, PLEASE NOTIFY YOUR PROVIDER AS SOON AS POSSIBLE!!  FYI: Medications such as Semaglutide (Ozempic, Wegovy), Tirzepatide (Mounjaro, Zepbound), Dulaglutide (Trulicity), etc (GLP1 agonists) AND Canagliflozin (Invokana), Dapagliflozin (Farxiga), Empagliflozin (Jardiance), Ertugliflozin (Steglatro), Bexagliflozin [employer]) or any combination with one of these drugs such as Invokamet (Canagliflozin/Metformin), Synjardy (Empagliflozin/Metformin), etc (SGLT2 inhibitors) must be held around the time of a procedure. This is not a comprehensive list of all of these drugs. Please review all of your medications and talk to your provider if you take any one of these. If you are not sure, ask your provider.   5.  The night before your procedure and the morning of your procedure scrub your neck/chest with CHG surgical scrub.  See instruction letter.  6. Plan to go home the same day, you will only stay overnight if medically necessary. 7. You MUST have a responsible adult to drive you home. 8. An adult MUST be with you the first 24 hours after you arrive home. 9. Bring a current list of your medications, and the last time and date medication taken. 10. Bring ID and current insurance cards. 11. Please wear clothes that are easy to get on and off and wear slip-on shoes.    You will follow up with the [facility] Device clinic 10-14 days after your procedure.  You will follow up with Dr. Jerel Croitoru 91 days after your procedure.  These appointments  will be made for you.   * If you have ANY questions after you get home, please call the office at [phone] or send a MyChart message.  FYI: For your safety, and to allow us  to monitor your vital signs accurately during the surgery/procedure we request that if you have artificial nails, gel coating, SNS etc. Please have those removed prior to your surgery/procedure. Not having the nail coverings /polish removed  may result in cancellation or delay of your surgery/procedure.    [facility] - Preparing For Surgery    Before surgery, you can play an important role. Because skin is not sterile, your skin needs to be as free of germs as possible. You can reduce the number of germs on your skin by washing with CHG (chlorahexidine gluconate) Soap before surgery.  CHG is an antiseptic cleaner which kills germs and bonds with the skin to continue killing germs even after washing.  Please do not use if you have an allergy to CHG or antibacterial soaps.  If your skin becomes reddened/irritated stop using the CHG.   Do not shave (including legs and underarms) for at least 48 hours prior to first CHG shower.  It is OK to shave your face.  Please follow these instructions carefully:  1.  Shower the night before surgery and the morning of surgery with CHG.  2.  If you choose to wash your hair, wash your hair first as usual with your normal shampoo.  3.  After you shampoo, rinse your hair and body thoroughly to remove the shampoo.  4.  Use CHG as you would any other liquid soap.  You can apply CHG directly to the skin and wash gently with a clean washcloth. 5.  Apply the CHG Soap to your body ONLY FROM THE NECK DOWN.  Do not use on open wounds or open sores.  Avoid contact with your eyes, ears, mouth and genitals (private parts).  Wash genitals (private parts) with your normal soap.  6.  Wash thoroughly, paying special attention to the area where your surgery will be performed.  7.  Thoroughly rinse your body with warm water from the neck down.   8.  DO NOT shower/wash with your normal soap after using and rinsing off the CHG soap.  9.  Pat yourself dry with a clean towel.           10.  Wear clean pajamas.           11.  Place clean sheets on your bed the night of your first shower and do not sleep with pets.  Day of Surgery: Do not apply any deodorants/lotions.  Please wear clean clothes to the hospital/surgery  center.

## 2024-03-19 ENCOUNTER — Encounter

## 2024-03-19 ENCOUNTER — Encounter (HOSPITAL_COMMUNITY): Admission: RE | Disposition: A | Payer: Self-pay | Source: Home / Self Care | Attending: Cardiovascular Disease

## 2024-03-19 ENCOUNTER — Other Ambulatory Visit: Payer: Self-pay

## 2024-03-19 ENCOUNTER — Ambulatory Visit (HOSPITAL_COMMUNITY)
Admission: RE | Admit: 2024-03-19 | Discharge: 2024-03-19 | Disposition: A | Attending: Cardiovascular Disease | Admitting: Cardiovascular Disease

## 2024-03-19 ENCOUNTER — Ambulatory Visit: Payer: Self-pay | Admitting: Physician Assistant

## 2024-03-19 DIAGNOSIS — Z45010 Encounter for checking and testing of cardiac pacemaker pulse generator [battery]: Secondary | ICD-10-CM

## 2024-03-19 DIAGNOSIS — I10 Essential (primary) hypertension: Secondary | ICD-10-CM | POA: Diagnosis not present

## 2024-03-19 DIAGNOSIS — I4719 Other supraventricular tachycardia: Secondary | ICD-10-CM | POA: Diagnosis not present

## 2024-03-19 DIAGNOSIS — R55 Syncope and collapse: Secondary | ICD-10-CM | POA: Diagnosis not present

## 2024-03-19 DIAGNOSIS — I739 Peripheral vascular disease, unspecified: Secondary | ICD-10-CM | POA: Diagnosis not present

## 2024-03-19 DIAGNOSIS — I4510 Unspecified right bundle-branch block: Secondary | ICD-10-CM | POA: Diagnosis not present

## 2024-03-19 HISTORY — PX: PPM GENERATOR CHANGEOUT: EP1233

## 2024-03-19 SURGERY — PPM GENERATOR CHANGEOUT

## 2024-03-19 MED ORDER — LIDOCAINE HCL (PF) 1 % IJ SOLN
INTRAMUSCULAR | Status: DC | PRN
  Administered 2024-03-19: 30 mL

## 2024-03-19 MED ORDER — SODIUM CHLORIDE 0.9% FLUSH: 3.0000 mL | INTRAVENOUS | Status: DC | PRN

## 2024-03-19 MED ORDER — LIDOCAINE HCL (PF) 1 % IJ SOLN
INTRAMUSCULAR | Status: AC
  Filled 2024-03-19: qty 60

## 2024-03-19 MED ORDER — CEFAZOLIN SODIUM-DEXTROSE 2-4 GM/100ML-% IV SOLN
INTRAVENOUS | Status: DC
  Filled 2024-03-19: qty 100

## 2024-03-19 MED ORDER — CHLORHEXIDINE GLUCONATE 4 % EX SOLN
4.0000 | Freq: Once | CUTANEOUS | Status: DC
  Filled 2024-03-19: qty 60

## 2024-03-19 MED ORDER — CEFAZOLIN SODIUM-DEXTROSE 2-4 GM/100ML-% IV SOLN: 2.0000 g | INTRAVENOUS | Status: DC

## 2024-03-19 MED ORDER — SODIUM CHLORIDE 0.9 % IV SOLN: 80.0000 mg | INTRAVENOUS | Status: DC

## 2024-03-19 MED ORDER — SODIUM CHLORIDE 0.9 % IV SOLN: 250.0000 mL | INTRAVENOUS | Status: DC

## 2024-03-19 MED ORDER — SODIUM CHLORIDE 0.9 % IV SOLN
INTRAVENOUS | Status: DC
  Filled 2024-03-19: qty 2

## 2024-03-19 MED ORDER — ONDANSETRON HCL 4 MG/2ML IJ SOLN: 4.0000 mg | Freq: Four times a day (QID) | INTRAMUSCULAR | Status: DC | PRN

## 2024-03-19 MED ORDER — POVIDONE-IODINE 10 % EX SWAB: 2.0000 | Freq: Once | CUTANEOUS | Status: DC

## 2024-03-19 MED ORDER — SODIUM CHLORIDE 0.9% FLUSH: 3.0000 mL | Freq: Two times a day (BID) | INTRAVENOUS | Status: DC

## 2024-03-19 MED ORDER — ACETAMINOPHEN 325 MG PO TABS: 325.0000 mg | ORAL_TABLET | ORAL | Status: DC | PRN

## 2024-03-19 MED ORDER — CEFAZOLIN SODIUM-DEXTROSE 2-3 GM-%(50ML) IV SOLR
INTRAVENOUS | Status: DC | PRN
  Administered 2024-03-19: 2 g via INTRAVENOUS

## 2024-03-19 MED ORDER — SODIUM CHLORIDE 0.9 % IV SOLN: INTRAVENOUS | Status: DC | PRN

## 2024-03-19 SURGICAL SUPPLY — 4 items
CABLE SURGICAL S-101-97-12 (CABLE) ×1 IMPLANT
ELECT DEFIB PAD ADLT CADENCE (PAD) IMPLANT
IPG PACE AZUR XT DR MRI W1DR01 (Pacemaker) IMPLANT
TRAY PACEMAKER INSERTION (PACKS) ×1 IMPLANT

## 2024-03-19 NOTE — Discharge Instructions (Addendum)
 Supplemental Discharge Instructions for  Pacemaker/Defibrillator Patients  Activity No restrictions. DO wear your seatbelt, even if it crosses over the pacemaker site.  WOUND CARE Keep the wound area clean and dry.  Remove the dressing the day after you return home (usually 48 hours after the procedure). DO NOT SUBMERGE UNDER WATER UNTIL FULLY HEALED (no tub baths, hot tubs, swimming pools, etc.).  You  may shower or take a sponge bath after the dressing is removed. DO NOT SOAK the area and do not allow the shower to directly spray on the site. If you have staples, these will be removed in the office in 7-14 days. If you have tape/steri-strips on your wound, these will fall off; do not pull them off prematurely.   No bandage is needed on the site.  DO  NOT apply any creams, oils, or ointments to the wound area. If you notice any drainage or discharge from the wound, any swelling, excessive redness or bruising at the site, or if you develop a fever > 101? F after you are discharged home, call the office at once.  Special Instructions You are still able to use cellular telephones.  Avoid carrying your cellular phone near your device. When traveling through airports, show security personnel your identification card to avoid being screened in the metal detectors.  Avoid arc welding equipment, MRI testing (magnetic resonance imaging), TENS units (transcutaneous nerve stimulators).  Call the office for questions about other devices. Avoid electrical appliances that are in poor condition or are not properly grounded. Microwave ovens are safe to be near or to operate.Implantable Cardiac Device Battery Change, Care After  This sheet gives you information about how to care for yourself after your procedure. Your health care provider may also give you more specific instructions. If you have problems or questions, contact your health care provider. What can I expect after the procedure? After your  procedure, it is common to have: Pain or soreness at the site where the cardiac device was inserted. Swelling at the site where the cardiac device was inserted. You should received an information card for your new device in 4-8 weeks. Follow these instructions at home: Incision care  Keep the incision clean and dry. Do not take baths, swim, or use a hot tub until after your wound check.  Pat the area dry with a clean towel. Do not rub the area. This may cause bleeding. Follow instructions from your health care provider about how to take care of your incision. Make sure you: If your incision is sealed with Steri-strips or staples, you may shower 7 days after your procedure or when told by your provider. Do not remove the steri-strips or let the shower hit directly on your site. You may wash around your site with soap and water.  Check your incision area every day for signs of infection. Check for: More redness, swelling, or pain. More fluid or blood. Warmth. Pus or a bad smell. Activity Do not lift anything that is heavier than 10 lb (4.5 kg) until your health care provider says it is okay to do so. For the first week, or as long as told by your health care provider: Avoid lifting your affected arm higher than your shoulder. After 1 week, Be gentle when you move your arms over your head. It is okay to raise your arm to comb your hair. Avoid strenuous exercise. Ask your health care provider when it is okay to: Resume your normal activities. Return to work  or school. Resume sexual activity. Eating and drinking Eat a heart-healthy diet. This should include plenty of fresh fruits and vegetables, whole grains, low-fat dairy products, and lean protein like chicken and fish. Limit alcohol intake to no more than 1 drink a day for non-pregnant women and 2 drinks a day for men. One drink equals 12 oz of beer, 5 oz of wine, or 1 oz of hard liquor. Check ingredients and nutrition facts on packaged  foods and beverages. Avoid the following types of food: Food that is high in salt (sodium). Food that is high in saturated fat, like full-fat dairy or red meat. Food that is high in trans fat, like fried food. Food and drinks that are high in sugar. Lifestyle Do not use any products that contain nicotine or tobacco, such as cigarettes and e-cigarettes. If you need help quitting, ask your health care provider. Take steps to manage and control your weight. Once cleared, get regular exercise. Aim for 150 minutes of moderate-intensity exercise (such as walking or yoga) or 75 minutes of vigorous exercise (such as running or swimming) each week. Manage other health problems, such as diabetes or high blood pressure. Ask your health care provider how you can manage these conditions. General instructions Do not drive for 24 hours after your procedure if you were given a medicine to help you relax (sedative). Take over-the-counter and prescription medicines only as told by your health care provider. Avoid putting pressure on the area where the cardiac device was placed. If you need an MRI after your cardiac device has been placed, be sure to tell the health care provider who orders the MRI that you have a cardiac device. Avoid close and prolonged exposure to electrical devices that have strong magnetic fields. These include: Cell phones. Avoid keeping them in a pocket near the cardiac device, and try using the ear opposite the cardiac device. MP3 players. Household appliances, like microwaves. Metal detectors. Electric generators. High-tension wires. Keep all follow-up visits as directed by your health care provider. This is important. Contact a health care provider if: You have pain at the incision site that is not relieved by over-the-counter or prescription medicines. You have any of these around your incision site or coming from it: More redness, swelling, or pain. Fluid or blood. Warmth to the  touch. Pus or a bad smell. You have a fever. You feel brief, occasional palpitations, light-headedness, or any symptoms that you think might be related to your heart. Get help right away if: You experience chest pain that is different from the pain at the cardiac device site. You develop a red streak that extends above or below the incision site. You experience shortness of breath. You have palpitations or an irregular heartbeat. You have light-headedness that does not go away quickly. You faint or have dizzy spells. Your pulse suddenly drops or increases rapidly and does not return to normal. You begin to gain weight and your legs and ankles swell. Summary After your procedure, it is common to have pain, soreness, and some swelling where the cardiac device was inserted. Make sure to keep your incision clean and dry. Follow instructions from your health care provider about how to take care of your incision. Check your incision every day for signs of infection, such as more pain or swelling, pus or a bad smell, warmth, or leaking fluid and blood. Avoid strenuous exercise and lifting your left arm higher than your shoulder for 2 weeks, or as long as told  by your health care provider. This information is not intended to replace advice given to you by your health care provider. Make sure you discuss any questions you have with your health care provider.

## 2024-03-19 NOTE — Progress Notes (Signed)
 Pt and husband received discharge instructions, teach back performed. Left chest site is clean dry intact, no signs of bleeding. IV removed, no complications, pt escorted out via wheelchair to husbands vehicle.

## 2024-03-19 NOTE — Op Note (Signed)
 Procedure report  Procedure performed:  Dual-chamber pacemaker generator changeout   Reason for procedure:  1.  Device generator at elective replacement interval  2.  Neurocardiogenic syncope Procedure performed by:  Jerel Balding, MD  Complications:  None  Estimated blood loss:  <5 mL  Medications administered during procedure:  Ancef  2 g intravenously, lidocaine  1% 30 mL locally Device details:   New Generator Medtronic Azure XT DR model number W1 DR 01, serial number [id number] Right atrial lead (chronic) Medtronic, model number 5076, serial number [id number] (implanted 01/14/2008) Right ventricular lead (chronic) Medtronic, model number S8684893, serial number [id number] (implanted 01/14/2008)  Explanted generator Medtronic Adapta L,  model number ADDRL1, serial number [id number] (implanted 01/14/2008)  Procedure details:  After the risks and benefits of the procedure were discussed the patient provided informed consent. She was brought to the cardiac catheter lab in the fasting state. The patient was prepped and draped in usual sterile fashion. Local anesthesia with 1% lidocaine  was administered to to the left infraclavicular area. A 5-6cm horizontal incision was made parallel with and 2-3 cm caudal to the left clavicle, in the area of an old scar. Using minimal electrocautery and mostly sharp and blunt dissection the prepectoral pocket was opened carefully to avoid injury to the loops of chronic leads. Extensive dissection was not necessary. The device was explanted. The pocket was carefully inspected for hemostasis and flushed with copious amounts of antibiotic solution.  The leads were disconnected from the old generator and connected to the new generator with appropriate pacing seen.  Telemetry testing of lead parameters showed comparable values with the preprocedure testing.  The entire system was then carefully inserted in the pocket with care taken to ensure the leads and device  assumed a comfortable position without pressure on the incision. Great care was taken that the leads be located deep to the generator. The pocket was then closed in layers using 2 layers of 3-0 Vicryl and 1 layer of 2-0 Vicryl and cutaneous Steri-Strips after which a sterile dressing was applied.   At the end of the procedure the following lead parameters were encountered:   Right atrial lead sensed P waves 1.4 mV, impedance 456 ohms, threshold 1.0 at 0.4 ms pulse width.  Right ventricular lead sensed R waves 3.3 mV, impedance 361 ohms, threshold 1.0 at 0.4 ms pulse width.  Jerel Balding, MD, [facility] [phone] office [phone] pager

## 2024-03-19 NOTE — Interval H&P Note (Signed)
 History and Physical Interval Note:  03/19/2024 1:46 PM  Tiffany Baird  has presented today for surgery, with the diagnosis of eri.  The various methods of treatment have been discussed with the patient and family. After consideration of risks, benefits and other options for treatment, the patient has consented to  Procedure(s): PPM GENERATOR CHANGEOUT (N/A) as a surgical intervention.  The patient's history has been reviewed, patient examined, no change in status, stable for surgery.  I have reviewed the patient's chart and labs.  Questions were answered to the patient's satisfaction.     [name]

## 2024-03-20 ENCOUNTER — Encounter (HOSPITAL_COMMUNITY): Payer: Self-pay | Admitting: Cardiovascular Disease

## 2024-03-23 DIAGNOSIS — Z0001 Encounter for general adult medical examination with abnormal findings: Secondary | ICD-10-CM | POA: Diagnosis not present

## 2024-03-23 DIAGNOSIS — I10 Essential (primary) hypertension: Secondary | ICD-10-CM | POA: Diagnosis not present

## 2024-03-23 DIAGNOSIS — M810 Age-related osteoporosis without current pathological fracture: Secondary | ICD-10-CM | POA: Diagnosis not present

## 2024-03-23 DIAGNOSIS — K449 Diaphragmatic hernia without obstruction or gangrene: Secondary | ICD-10-CM | POA: Diagnosis not present

## 2024-03-23 DIAGNOSIS — B001 Herpesviral vesicular dermatitis: Secondary | ICD-10-CM | POA: Diagnosis not present

## 2024-03-23 DIAGNOSIS — I4510 Unspecified right bundle-branch block: Secondary | ICD-10-CM | POA: Diagnosis not present

## 2024-03-23 DIAGNOSIS — K219 Gastro-esophageal reflux disease without esophagitis: Secondary | ICD-10-CM | POA: Diagnosis not present

## 2024-03-23 DIAGNOSIS — Z950 Presence of cardiac pacemaker: Secondary | ICD-10-CM | POA: Diagnosis not present

## 2024-03-23 DIAGNOSIS — N1831 Chronic kidney disease, stage 3a: Secondary | ICD-10-CM | POA: Diagnosis not present

## 2024-03-23 DIAGNOSIS — I495 Sick sinus syndrome: Secondary | ICD-10-CM | POA: Diagnosis not present

## 2024-04-02 ENCOUNTER — Ambulatory Visit: Attending: Cardiovascular Disease

## 2024-04-02 DIAGNOSIS — I495 Sick sinus syndrome: Secondary | ICD-10-CM

## 2024-04-02 LAB — CUP PACEART INCLINIC DEVICE CHECK
Date Time Interrogation Session: 20251218154618
Implantable Lead Connection Status: 753985
Implantable Lead Connection Status: 753985
Implantable Lead Implant Date: 20090930
Implantable Lead Implant Date: 20090930
Implantable Lead Location: 753859
Implantable Lead Location: 753860
Implantable Lead Model: 5076
Implantable Lead Model: 5076
Implantable Pulse Generator Implant Date: 20251204

## 2024-04-02 NOTE — Progress Notes (Signed)
 Normal DUAL chamber pacemaker wound check. Presenting rhythm: AS/VS 90. Wound well healed. Routine testing of thresholds, sensing, and impedance demonstrate stable parameters and no programming changes needed at this time. No episodes. Estimated longevity 15 years. Pt enrolled in remote follow-up.

## 2024-04-02 NOTE — Patient Instructions (Signed)

## 2024-04-06 DIAGNOSIS — R300 Dysuria: Secondary | ICD-10-CM | POA: Diagnosis not present

## 2024-04-06 DIAGNOSIS — Z124 Encounter for screening for malignant neoplasm of cervix: Secondary | ICD-10-CM | POA: Diagnosis not present

## 2024-04-06 DIAGNOSIS — Z01419 Encounter for gynecological examination (general) (routine) without abnormal findings: Secondary | ICD-10-CM | POA: Diagnosis not present

## 2024-04-07 DIAGNOSIS — M19079 Primary osteoarthritis, unspecified ankle and foot: Secondary | ICD-10-CM | POA: Diagnosis not present

## 2024-04-07 DIAGNOSIS — M792 Neuralgia and neuritis, unspecified: Secondary | ICD-10-CM | POA: Diagnosis not present

## 2024-04-07 DIAGNOSIS — Z9889 Other specified postprocedural states: Secondary | ICD-10-CM | POA: Diagnosis not present

## 2024-04-07 DIAGNOSIS — I739 Peripheral vascular disease, unspecified: Secondary | ICD-10-CM | POA: Diagnosis not present

## 2024-04-07 DIAGNOSIS — L84 Corns and callosities: Secondary | ICD-10-CM | POA: Diagnosis not present

## 2024-04-14 ENCOUNTER — Encounter

## 2024-04-19 ENCOUNTER — Ambulatory Visit

## 2024-04-20 ENCOUNTER — Encounter

## 2024-04-30 ENCOUNTER — Ambulatory Visit: Attending: Cardiovascular Disease

## 2024-04-30 DIAGNOSIS — I495 Sick sinus syndrome: Secondary | ICD-10-CM | POA: Diagnosis not present

## 2024-04-30 LAB — CUP PACEART REMOTE DEVICE CHECK
Battery Remaining Longevity: 181 mo
Battery Voltage: 3.2 V
Brady Statistic AP VP Percent: 0.01 %
Brady Statistic AP VS Percent: 0.29 %
Brady Statistic AS VP Percent: 0.04 %
Brady Statistic AS VS Percent: 99.67 %
Brady Statistic RA Percent Paced: 0.29 %
Brady Statistic RV Percent Paced: 0.05 %
Date Time Interrogation Session: 20260114203640
Implantable Lead Connection Status: 753985
Implantable Lead Connection Status: 753985
Implantable Lead Implant Date: 20090930
Implantable Lead Implant Date: 20090930
Implantable Lead Location: 753859
Implantable Lead Location: 753860
Implantable Lead Model: 5076
Implantable Lead Model: 5076
Implantable Pulse Generator Implant Date: 20251204
Lead Channel Impedance Value: 323 Ohm
Lead Channel Impedance Value: 361 Ohm
Lead Channel Impedance Value: 399 Ohm
Lead Channel Impedance Value: 456 Ohm
Lead Channel Pacing Threshold Amplitude: 1 V
Lead Channel Pacing Threshold Amplitude: 1.125 V
Lead Channel Pacing Threshold Pulse Width: 0.4 ms
Lead Channel Pacing Threshold Pulse Width: 0.4 ms
Lead Channel Sensing Intrinsic Amplitude: 1.125 mV
Lead Channel Sensing Intrinsic Amplitude: 1.125 mV
Lead Channel Sensing Intrinsic Amplitude: 2.75 mV
Lead Channel Sensing Intrinsic Amplitude: 2.75 mV
Lead Channel Setting Pacing Amplitude: 1.75 V
Lead Channel Setting Pacing Amplitude: 2 V
Lead Channel Setting Pacing Pulse Width: 0.4 ms
Lead Channel Setting Sensing Sensitivity: 0.9 mV
Zone Setting Status: 755011

## 2024-05-02 ENCOUNTER — Ambulatory Visit: Payer: Self-pay | Admitting: Cardiovascular Disease

## 2024-05-08 NOTE — Progress Notes (Signed)
 Remote PPM Transmission

## 2024-05-20 ENCOUNTER — Ambulatory Visit

## 2024-05-21 ENCOUNTER — Encounter

## 2024-05-21 ENCOUNTER — Other Ambulatory Visit (HOSPITAL_COMMUNITY): Payer: Self-pay | Admitting: Obstetrics & Gynecology

## 2024-05-21 DIAGNOSIS — D259 Leiomyoma of uterus, unspecified: Secondary | ICD-10-CM

## 2024-05-26 ENCOUNTER — Ambulatory Visit (HOSPITAL_COMMUNITY)

## 2024-06-20 ENCOUNTER — Ambulatory Visit

## 2024-06-22 ENCOUNTER — Encounter

## 2024-07-02 ENCOUNTER — Ambulatory Visit: Admitting: Cardiovascular Disease

## 2024-07-14 ENCOUNTER — Encounter

## 2024-07-21 ENCOUNTER — Ambulatory Visit

## 2024-07-23 ENCOUNTER — Encounter

## 2024-07-30 ENCOUNTER — Ambulatory Visit

## 2024-10-13 ENCOUNTER — Encounter

## 2024-10-29 ENCOUNTER — Ambulatory Visit

## 2025-01-28 ENCOUNTER — Ambulatory Visit

## 2025-04-29 ENCOUNTER — Ambulatory Visit
